# Patient Record
Sex: Female | Born: 1975 | Race: White | Hispanic: Yes | Marital: Married | Smoking: Never smoker
Health system: Southern US, Community
[De-identification: ages and names within clinical notes are randomized; demographics above are authoritative.]

## PROBLEM LIST (undated history)

## (undated) ENCOUNTER — Inpatient Hospital Stay (HOSPITAL_COMMUNITY): Payer: Self-pay

## (undated) DIAGNOSIS — O139 Gestational [pregnancy-induced] hypertension without significant proteinuria, unspecified trimester: Secondary | ICD-10-CM

## (undated) DIAGNOSIS — D649 Anemia, unspecified: Secondary | ICD-10-CM

## (undated) DIAGNOSIS — I10 Essential (primary) hypertension: Secondary | ICD-10-CM

## (undated) DIAGNOSIS — F419 Anxiety disorder, unspecified: Secondary | ICD-10-CM

## (undated) DIAGNOSIS — F32A Depression, unspecified: Secondary | ICD-10-CM

## (undated) DIAGNOSIS — F329 Major depressive disorder, single episode, unspecified: Secondary | ICD-10-CM

## (undated) HISTORY — DX: Anemia, unspecified: D64.9

## (undated) HISTORY — DX: Essential (primary) hypertension: I10

## (undated) HISTORY — DX: Anxiety disorder, unspecified: F41.9

---

## 1998-10-11 ENCOUNTER — Encounter: Payer: Self-pay | Admitting: Obstetrics

## 1998-10-11 ENCOUNTER — Ambulatory Visit (HOSPITAL_COMMUNITY): Admission: RE | Admit: 1998-10-11 | Discharge: 1998-10-11 | Payer: Self-pay | Admitting: Obstetrics

## 1999-02-27 ENCOUNTER — Inpatient Hospital Stay (HOSPITAL_COMMUNITY): Admission: AD | Admit: 1999-02-27 | Discharge: 1999-03-01 | Payer: Self-pay | Admitting: Obstetrics

## 2001-03-02 ENCOUNTER — Encounter (INDEPENDENT_AMBULATORY_CARE_PROVIDER_SITE_OTHER): Payer: Self-pay | Admitting: *Deleted

## 2001-03-02 LAB — CONVERTED CEMR LAB

## 2002-04-20 ENCOUNTER — Encounter: Admission: RE | Admit: 2002-04-20 | Discharge: 2002-04-20 | Payer: Self-pay | Admitting: Family Medicine

## 2002-09-28 ENCOUNTER — Encounter: Admission: RE | Admit: 2002-09-28 | Discharge: 2002-09-28 | Payer: Self-pay | Admitting: Family Medicine

## 2003-01-26 ENCOUNTER — Encounter: Admission: RE | Admit: 2003-01-26 | Discharge: 2003-01-26 | Payer: Self-pay | Admitting: Family Medicine

## 2003-08-30 ENCOUNTER — Encounter: Admission: RE | Admit: 2003-08-30 | Discharge: 2003-08-30 | Payer: Self-pay | Admitting: *Deleted

## 2003-09-06 ENCOUNTER — Encounter: Admission: RE | Admit: 2003-09-06 | Discharge: 2003-09-06 | Payer: Self-pay | Admitting: *Deleted

## 2003-09-06 ENCOUNTER — Inpatient Hospital Stay (HOSPITAL_COMMUNITY): Admission: AD | Admit: 2003-09-06 | Discharge: 2003-09-09 | Payer: Self-pay | Admitting: Obstetrics & Gynecology

## 2003-09-06 ENCOUNTER — Ambulatory Visit (HOSPITAL_COMMUNITY): Admission: RE | Admit: 2003-09-06 | Discharge: 2003-09-06 | Payer: Self-pay | Admitting: Obstetrics and Gynecology

## 2006-08-30 ENCOUNTER — Encounter (INDEPENDENT_AMBULATORY_CARE_PROVIDER_SITE_OTHER): Payer: Self-pay | Admitting: *Deleted

## 2007-03-07 ENCOUNTER — Encounter: Admission: RE | Admit: 2007-03-07 | Discharge: 2007-03-07 | Payer: Self-pay | Admitting: Family Medicine

## 2009-02-24 ENCOUNTER — Ambulatory Visit (HOSPITAL_COMMUNITY): Admission: RE | Admit: 2009-02-24 | Discharge: 2009-02-24 | Payer: Self-pay | Admitting: Family Medicine

## 2009-05-22 ENCOUNTER — Inpatient Hospital Stay (HOSPITAL_COMMUNITY): Admission: AD | Admit: 2009-05-22 | Discharge: 2009-05-25 | Payer: Self-pay | Admitting: Obstetrics and Gynecology

## 2009-05-22 ENCOUNTER — Inpatient Hospital Stay (HOSPITAL_COMMUNITY): Admission: AD | Admit: 2009-05-22 | Discharge: 2009-05-22 | Payer: Self-pay | Admitting: Obstetrics and Gynecology

## 2009-05-28 ENCOUNTER — Inpatient Hospital Stay (HOSPITAL_COMMUNITY): Admission: AD | Admit: 2009-05-28 | Discharge: 2009-05-28 | Payer: Self-pay | Admitting: Obstetrics and Gynecology

## 2009-11-24 ENCOUNTER — Ambulatory Visit: Payer: Self-pay | Admitting: Family Medicine

## 2009-12-02 ENCOUNTER — Ambulatory Visit: Payer: Self-pay | Admitting: Internal Medicine

## 2009-12-22 ENCOUNTER — Ambulatory Visit: Payer: Self-pay | Admitting: Internal Medicine

## 2010-01-05 ENCOUNTER — Ambulatory Visit: Payer: Self-pay | Admitting: Internal Medicine

## 2010-02-15 ENCOUNTER — Ambulatory Visit: Payer: Self-pay | Admitting: Internal Medicine

## 2010-07-23 ENCOUNTER — Encounter: Payer: Self-pay | Admitting: Family Medicine

## 2010-10-04 LAB — CBC
Hemoglobin: 10.7 g/dL — ABNORMAL LOW (ref 12.0–15.0)
MCHC: 33.4 g/dL (ref 30.0–36.0)
MCHC: 33.6 g/dL (ref 30.0–36.0)
MCHC: 33.8 g/dL (ref 30.0–36.0)
MCV: 87.4 fL (ref 78.0–100.0)
MCV: 87.9 fL (ref 78.0–100.0)
Platelets: 145 10*3/uL — ABNORMAL LOW (ref 150–400)
Platelets: 166 10*3/uL (ref 150–400)
RBC: 3.61 MIL/uL — ABNORMAL LOW (ref 3.87–5.11)
RBC: 4.14 MIL/uL (ref 3.87–5.11)
RDW: 14.3 % (ref 11.5–15.5)
WBC: 12.6 10*3/uL — ABNORMAL HIGH (ref 4.0–10.5)
WBC: 14.1 10*3/uL — ABNORMAL HIGH (ref 4.0–10.5)

## 2010-10-04 LAB — CROSSMATCH
ABO/RH(D): A POS
Antibody Screen: NEGATIVE

## 2010-10-04 LAB — PROTIME-INR: Prothrombin Time: 14.1 seconds (ref 11.6–15.2)

## 2010-10-04 LAB — RPR: RPR Ser Ql: NONREACTIVE

## 2010-11-17 NOTE — Discharge Summary (Signed)
Nicole Dodson, Nicole Dodson                  ACCOUNT NO.:  [id number]   MEDICAL RECORD NO.:  [id number]                   PATIENT TYPE:  INP   LOCATION:  9101                                 FACILITY:  [facility]   PHYSICIAN:  Franklyn Lor, MD                      DATE OF BIRTH:  1976-01-15   DATE OF ADMISSION:  09/06/2003  DATE OF DISCHARGE:  09/09/2003                                 DISCHARGE SUMMARY   ADMISSION DIAGNOSIS:  Primary Cesarean section secondary to history of  shoulder dystocia.   This is a 35 year old, G2, P1-0-0-1, who presented at 38-37 weeks estimated  gestational age by LMP and first trimester ultrasound complaining of  contractions times one day.  At that time her membranes were intact and she  had minimal vaginal spotting. Positive fetal movement. Contractions  approximately five minutes apart. The patient was A positive, antibody  negative, syphilis nonreactive, HIV negative, GC and Chlamydia negative, GBS  negative, rubella immune, and hepatitis B surface antigen negative.  At the  time of admission she was afebrile with normal vital signs. Of note the  patient had a history of with her first pregnancy of intrapartum  complications including three minute shoulder dystocia delivery with a 9-  pound female with a 4th degree perineal laceration. Based on this history, the  patient was admitted for primary Cesarean section. On September 06, 2003, Dr.  Elsie Lincoln performed a primary low transverse Cesarean section on this  patient and gave rise to a viable female infant with Apgars of 9/9. Baby  weighed 9 pounds and 12 ounces, and had clear fluids, was in vertex  position, three-vessel placenta, delivered spontaneously. Estimated blood  loss was 800 cc. The patient had an uncomplicated postoperative course with  a postoperative hemoglobin of 10.1. She was breastfeeding without  difficulty. Given ibuprofen 600 mg q.6h. p.r.n. pain and Percocet 5/325 mg  q.4h. p.r.n. pain. On  discharge, the patient was given prescription for  prenatal vitamins one p.o. daily. The patient's staples were removed prior  to discharge. The patient was given instructions on follow-up and she chose  to have IUD insertion done at six-week follow-up visit.                                               Franklyn Lor, MD    TD/MEDQ  D:  09/28/2003  T:  09/28/2003  Job:  [id number]

## 2010-11-17 NOTE — Op Note (Signed)
NAME:  Nicole Dodson, Nicole Dodson                  ACCOUNT NO.:  [id number]   MEDICAL RECORD NO.:  [id number]                   PATIENT TYPE:  INP   LOCATION:  9101                                 FACILITY:  [facility]   PHYSICIAN:  Lesly Dukes, M.D.              DATE OF BIRTH:  [birth year]   DATE OF PROCEDURE:  09/06/2003  DATE OF DISCHARGE:                                 OPERATIVE REPORT   PREOPERATIVE DIAGNOSIS:  Para 1-0-0-1 at 39-3/7 weeks with history of  shoulder dystocia and estimated fetal weight of 4400 g this pregnancy in  active labor.   POSTOPERATIVE DIAGNOSIS:  Para 1-0-0-1 at 39-3/7 weeks with history of  shoulder dystocia and estimated fetal weight of 4400 g this pregnancy in  active labor.   PROCEDURE:  Primary low flap transverse cesarean section.   SURGEON:  Lesly Dukes, M.D.   ASSISTANT:  Bradly Bienenstock, M.D.   ESTIMATED BLOOD LOSS:  800 mL.   COMPLICATIONS:  None.   PATHOLOGY:  None.   FINDINGS:  Viable female infant, Apgars of 9 at one and 9 at five, weight 9  pounds 12 ounces, clear fluid, vertex, normal fallopian tubes and ovaries  bilaterally and normal uterus; __________ at delivery.   DESCRIPTION OF PROCEDURE:  After informed consent was obtained, the patient  was taken to the operating room where spinal anesthesia was found to be  adequate.  The patient was prepped and draped in the normal sterile fashion.  Foley was placed in the bladder.  Pfannenstiel skin incision was made with  scalpel.  It was carried down to underlying layer of fascia.  The fascia was  incised in the midline and extended bilaterally with Mayo scissors.  Superior and inferior  aspects of the fascial incision were grasped with  Kocher clamps, tented up and dissected off sharply and bluntly from  underlying layers of rectus muscles.  The rectus muscle were separated in  the midline.  Peritoneum was identified, tented up and entered sharply with  Metzenbaum scissors.  Incision  was extended both superior and inferiorly  with good visualization of the bladder.  The bladder blade was inserted.  Vesicouterine peritoneum was identified, tented up and entered sharply with  Metzenbaum scissors.  This incision was extended bilaterally, the bladder  flap was created digitally.  Bladder blade was reinserted.  The uterine  incision was made in transverse fashion in lower uterine segment, extended  bilaterally with bandage scissors.  The baby's head __________ and mouth  suctioned.  The rest of the baby's body was delivered without complication.  The cord was clamped and cut and the baby was handed off to the awaiting  pediatrician.  Cord blood was set for type and screen.  The placenta  delivered spontaneously with three vessel cord.  The uterus was cleared of  all clots and debris.  The uterine incision was closed with 0 Vicryl in a  running locked fashion.  Good hemostasis  was noted.  The gutters were  cleared of all clots and debris and uterine hemostasis was assured one last  time.  The fascia was closed with 0  Vicryl in a running fashion.  Good hemostasis was noted.  Subcuticular  tissues was copiously irrigated and skin was closed with staples.  The  patient tolerated the procedure well.  Sponge, lap, needle and instrument  counts correct x 2.  The patient taken to the recovery room in stable  condition.                                               Lesly Dukes, M.D.    Lora Paula  D:  09/06/2003  T:  09/07/2003  Job:  [id number]

## 2011-10-26 ENCOUNTER — Other Ambulatory Visit: Payer: Self-pay | Admitting: Family Medicine

## 2012-03-28 ENCOUNTER — Inpatient Hospital Stay (HOSPITAL_COMMUNITY): Payer: Self-pay

## 2012-03-28 ENCOUNTER — Inpatient Hospital Stay (HOSPITAL_COMMUNITY)
Admission: AD | Admit: 2012-03-28 | Discharge: 2012-03-28 | Disposition: A | Discharge status: Home / Self Care | Payer: Self-pay | Source: Ambulatory Visit | Attending: Obstetrics & Gynecology | Admitting: Obstetrics & Gynecology

## 2012-03-28 ENCOUNTER — Encounter (HOSPITAL_COMMUNITY): Payer: Self-pay | Admitting: *Deleted

## 2012-03-28 DIAGNOSIS — N949 Unspecified condition associated with female genital organs and menstrual cycle: Secondary | ICD-10-CM | POA: Insufficient documentation

## 2012-03-28 DIAGNOSIS — L293 Anogenital pruritus, unspecified: Secondary | ICD-10-CM | POA: Insufficient documentation

## 2012-03-28 DIAGNOSIS — Z30432 Encounter for removal of intrauterine contraceptive device: Secondary | ICD-10-CM | POA: Insufficient documentation

## 2012-03-28 DIAGNOSIS — O2630 Retained intrauterine contraceptive device in pregnancy, unspecified trimester: Secondary | ICD-10-CM

## 2012-03-28 DIAGNOSIS — O9989 Other specified diseases and conditions complicating pregnancy, childbirth and the puerperium: Secondary | ICD-10-CM

## 2012-03-28 DIAGNOSIS — O99891 Other specified diseases and conditions complicating pregnancy: Secondary | ICD-10-CM | POA: Insufficient documentation

## 2012-03-28 HISTORY — DX: Major depressive disorder, single episode, unspecified: F32.9

## 2012-03-28 HISTORY — DX: Gestational (pregnancy-induced) hypertension without significant proteinuria, unspecified trimester: O13.9

## 2012-03-28 HISTORY — DX: Depression, unspecified: F32.A

## 2012-03-28 LAB — POCT PREGNANCY, URINE: Preg Test, Ur: POSITIVE — AB

## 2012-03-28 LAB — WET PREP, GENITAL: Yeast Wet Prep HPF POC: NONE SEEN

## 2012-03-28 MED ORDER — PRENATAL PLUS 27-1 MG PO TABS: 1.0000 | ORAL_TABLET | Freq: Every day | ORAL | Status: DC

## 2012-03-28 NOTE — MAU Note (Signed)
Patient stats she has had a IUD for 3 years. Has had a positive pregnancy test at the [facility]. Has had some nausea. No pain or bleeding. Patient wants to have the IUD taken out. Has a vaginal discharge with itching.

## 2012-03-28 NOTE — MAU Note (Signed)
This patient remains at the registration desk waiting for a translator.

## 2012-03-28 NOTE — MAU Provider Note (Signed)
Chief Complaint: Possible Pregnancy   First Provider Initiated Contact with Patient 03/28/12 1632     SUBJECTIVE HPI: Nicole Dodson is a 36 y.o. G3P3 at [redacted]w[redacted]d by LMP who presents with request to remove IUD which has been in place for 3 years. She is worried that she's pregnant with an IUD in place. She denies bleeding or abdominal pain.She also has increased vaginal discharge, slightly pruritic at times.  Past Medical History  Diagnosis Date  . Depression   . Pregnancy induced hypertension    OB History    Grav Para Term Preterm Abortions TAB SAB Ect Mult Living   3 3        3      # Outc Date GA Lbr Len/2nd Wgt Sex Del Anes PTL Lv   1 PAR            2 PAR            3 PAR              Past Surgical History  Procedure Date  . Cesarean section    History   Social History  . Marital Status: Married    Spouse Name: N/A    Number of Children: N/A  . Years of Education: N/A   Occupational History  . Not on file.   Social History Main Topics  . Smoking status: Never Smoker   . Smokeless tobacco: Not on file  . Alcohol Use: No  . Drug Use:   . Sexually Active: Yes    Birth Control/ Protection: IUD   Other Topics Concern  . Not on file   Social History Narrative  . No narrative on file   No current facility-administered medications on file prior to encounter.   No current outpatient prescriptions on file prior to encounter.   No Known Allergies  ROS: Pertinent items in HPI  OBJECTIVE Blood pressure 125/76, pulse 92, temperature 98.4 F (36.9 C), temperature source Oral, resp. rate 16, height 5' 1.5" (1.562 m), weight 70.217 kg (154 lb 12.8 oz), last menstrual period 02/13/2012, SpO2 100.00%. GENERAL: Well-developed, well-nourished female in no acute distress.  HEENT: Normocephalic HEART: normal rate RESP: normal effort ABDOMEN: Soft, non-tender EXTREMITIES: Nontender, no edema NEURO: Alert and oriented SPECULUM EXAM: NEFG, physiologic discharge, no  blood noted, cervix clean. [place] IUD strings protruding 1 cm from external os. BIMANUAL: cervix posterior/ thick/ closed; uterus ULNS, no adnexal tenderness or masses  LAB RESULTS Results for orders placed during the hospital encounter of 03/28/12 (from the past 24 hour(s))  POCT PREGNANCY, URINE     Status: Abnormal   Collection Time   03/28/12  4:17 PM      Component Value Range   Preg Test, Ur POSITIVE (*) NEGATIVE  WET PREP, GENITAL     Status: Abnormal   Collection Time   03/28/12  5:05 PM      Component Value Range   Yeast Wet Prep HPF POC NONE SEEN  NONE SEEN   Trich, Wet Prep NONE SEEN  NONE SEEN   Clue Cells Wet Prep HPF POC NONE SEEN  NONE SEEN   WBC, Wet Prep HPF POC FEW (*) NONE SEEN    IMAGING  Clinical Data: Post removal of IUD.  OBSTETRIC <14 WK [place] AND TRANSVAGINAL OB US  Technique: Both transabdominal and transvaginal ultrasound  examinations were performed for complete evaluation of the  gestation as well as the maternal uterus, adnexal regions, and  pelvic cul-de-sac. Transvaginal technique  was performed to assess  early pregnancy.  Comparison: None.  Intrauterine gestational sac: Present and single  Yolk sac: Present  Embryo: Not identified  MSD: 15 mm six w three d  Maternal uterus/adnexae:  Normal uterus and ovaries. Corpus luteal cyst in the right ovary.  No free fluid.  IMPRESSION:  1. Single intrauterine gestation sac with yolk sac is most  consistent with an early intrauterine pregnancy.  2. Estimated age by mean sac diameter equals 6 weeks 3 days.  Original Report Authenticated By: Genevive Bi, M.D. results found.  MAU COURSE Procedure: IUD Removal  Via interpreter, I explained indication for removal of IUD in 6 wk pregnancy: to reduce the risk or miscarriage and infection; alternative of leaving it in place could lead to SAB, infection, PTB. Waiting longer increases the chance the strings will retract into the endometrial cavity making  removal more difficult and risky. She understands and elects to proceed with removal. Patient was in the dorsal lithotomy position, normal external genitalia was noted.  A speculum was placed in the patient's vagina, normal discharge was noted, no lesions. The multiparous cervix was visualized, no lesions, no abnormal discharge; WP, GC/CT cultures obtained.  The strings of the IUD were grasped and pulled using ring forceps.  The IUD was successfully removed in its entirety.  Patient tolerated the procedure well.     ASSESSMENT 1. Pregnancy with IUD in place, antepartum   G2P1001 at [redacted]w[redacted]d with early IUP IUD removed  PLAN Discharge home. Return if heavy bleeding or abdominal pain.    Medication List     As of 03/28/2012  6:02 PM    TAKE these medications         prenatal vitamin w/FE, FA 27-1 MG Tabs   Take 1 tablet by mouth daily.        list of providers, pregnancy precautions, maturity eligibility instructions given C/W Dr. Ellison Carwin, CNM 03/28/2012  4:33 PM

## 2012-03-29 LAB — GC/CHLAMYDIA PROBE AMP, GENITAL: Chlamydia, DNA Probe: NEGATIVE

## 2012-05-08 ENCOUNTER — Other Ambulatory Visit (HOSPITAL_COMMUNITY): Payer: Self-pay

## 2012-05-08 ENCOUNTER — Other Ambulatory Visit (HOSPITAL_COMMUNITY): Payer: Self-pay | Admitting: Obstetrics

## 2012-05-08 ENCOUNTER — Ambulatory Visit (HOSPITAL_COMMUNITY)
Admission: RE | Admit: 2012-05-08 | Discharge: 2012-05-08 | Disposition: A | Discharge status: Home / Self Care | Payer: Self-pay | Source: Ambulatory Visit | Attending: Obstetrics | Admitting: Obstetrics

## 2012-05-08 DIAGNOSIS — O09299 Supervision of pregnancy with other poor reproductive or obstetric history, unspecified trimester: Secondary | ICD-10-CM | POA: Insufficient documentation

## 2014-05-03 ENCOUNTER — Encounter (HOSPITAL_COMMUNITY): Payer: Self-pay | Admitting: *Deleted

## 2020-07-21 ENCOUNTER — Other Ambulatory Visit: Payer: Self-pay | Admitting: Obstetrics and Gynecology

## 2020-07-21 DIAGNOSIS — Z1231 Encounter for screening mammogram for malignant neoplasm of breast: Secondary | ICD-10-CM

## 2020-08-05 ENCOUNTER — Other Ambulatory Visit: Payer: Self-pay

## 2020-08-05 ENCOUNTER — Ambulatory Visit (HOSPITAL_COMMUNITY)
Admission: EM | Admit: 2020-08-05 | Discharge: 2020-08-05 | Disposition: A | Discharge status: Home / Self Care | Payer: Self-pay | Attending: Urgent Care | Admitting: Urgent Care

## 2020-08-05 ENCOUNTER — Encounter (HOSPITAL_COMMUNITY): Payer: Self-pay

## 2020-08-05 DIAGNOSIS — I10 Essential (primary) hypertension: Secondary | ICD-10-CM

## 2020-08-05 DIAGNOSIS — Z3202 Encounter for pregnancy test, result negative: Secondary | ICD-10-CM

## 2020-08-05 DIAGNOSIS — N644 Mastodynia: Secondary | ICD-10-CM

## 2020-08-05 LAB — POC URINE PREG, ED: Preg Test, Ur: NEGATIVE

## 2020-08-05 MED ORDER — LOSARTAN POTASSIUM 50 MG PO TABS: 50.0000 mg | ORAL_TABLET | Freq: Every day | ORAL | 0 refills | Status: DC

## 2020-08-05 MED ORDER — NAPROXEN 375 MG PO TABS: 375.0000 mg | ORAL_TABLET | Freq: Two times a day (BID) | ORAL | 0 refills | Status: DC

## 2020-08-05 NOTE — ED Provider Notes (Signed)
Nicole Dodson - [facility]   MRN: [id number] DOB: June 19, 1976  Subjective:   Nicole Dodson is a 45 y.o. female presenting for 1 month history of persistent recurrent bilateral breast tenderness.  Has had this problem before more than 10 years ago and was told that she had fibroids in her breast.  Denies any fever, skin changes, nipple discharge, rashes, nausea, vomiting, cough, shortness of breath.  She actually called the breast center and scheduled an appointment for mammogram and has it in 2 weeks.  She does not have a regular care doctor.  Has concerns about her high blood pressure.  States that she checks it at home and is usually in the 140s as well.  Has never been prescribed medication for this.  Admits that she does use a lot of salt in her diet.  No current facility-administered medications for this encounter.  Current Outpatient Medications:  .  prenatal vitamin w/FE, FA (PRENATAL 1 + 1) 27-1 MG TABS, Take 1 tablet by mouth daily., Disp: 30 each, Rfl: 0   No Known Allergies  Past Medical History:  Diagnosis Date  . Depression   . Pregnancy induced hypertension      Past Surgical History:  Procedure Laterality Date  . CESAREAN SECTION      History reviewed. No pertinent family history.  Social History   Tobacco Use  . Smoking status: Never Smoker  . Smokeless tobacco: Never Used  Substance Use Topics  . Alcohol use: No    ROS   Objective:   Vitals: BP (!) 162/85 (BP Location: Right Arm)   Pulse 82   Temp 97.9 F (36.6 C) (Oral)   Resp 18   LMP 07/16/2020 (Exact Date)   SpO2 100%   Breastfeeding Unknown   Physical Exam Exam conducted with a chaperone present Water quality scientist).  Constitutional:      General: She is not in acute distress.    Appearance: Normal appearance. She is well-developed. She is not ill-appearing, toxic-appearing or diaphoretic.  HENT:     Head: Normocephalic and atraumatic.     Nose: Nose normal.     Mouth/Throat:      Mouth: Mucous membranes are moist.  Eyes:     Extraocular Movements: Extraocular movements intact.     Pupils: Pupils are equal, round, and reactive to light.  Cardiovascular:     Rate and Rhythm: Normal rate and regular rhythm.     Pulses: Normal pulses.     Heart sounds: Normal heart sounds. No murmur heard. No friction rub. No gallop.   Pulmonary:     Effort: Pulmonary effort is normal. No respiratory distress.     Breath sounds: Normal breath sounds. No stridor. No wheezing, rhonchi or rales.  Chest:  Breasts: Breasts are symmetrical.     Right: Tenderness present. No swelling, bleeding, inverted nipple, mass, nipple discharge or skin change.     Left: Tenderness present. No swelling, bleeding, inverted nipple, mass, nipple discharge or skin change.      Comments: Tenderness about either breast, right worse than the left. Skin:    General: Skin is warm and dry.     Findings: No rash.  Neurological:     Mental Status: She is alert and oriented to person, place, and time.  Psychiatric:        Mood and Affect: Mood normal.        Behavior: Behavior normal.        Thought Content: Thought content normal.  Results for orders placed or performed during the hospital encounter of 08/05/20 (from the past 24 hour(s))  POC urine preg, ED (not at [facility])     Status: None   Collection Time: 08/05/20 11:13 AM  Result Value Ref Range   Preg Test, Ur NEGATIVE NEGATIVE    Assessment and Plan :   PDMP not reviewed this encounter.  1. Breast pain   2. Mastalgia   3. Essential hypertension     Counseled on need for dietary modifications, start losartan.  Regarding her breast pain, will have her use naproxen, suspect fibrocystic changes.  Avoid a lot of caffeine products.  Hydrate better.  Keep appointment for mammogram.  Establish care with new PCP for follow-up. Counseled patient on potential for adverse effects with medications prescribed/recommended today, ER and return-to-clinic  precautions discussed, patient verbalized understanding.    Wallis Bamberg, PA-C 08/05/20 1230

## 2020-08-05 NOTE — ED Triage Notes (Signed)
Pt presents with bilateral breast tenderness and arms pain x 20 day. States this happened in the past and was told 12-13 years ago she have fibroids in the breast. Denies breast drainage, changes in nipple color, shape or texture.

## 2020-08-05 NOTE — ED Notes (Signed)
Chaperoned provider for breast exam

## 2020-08-05 NOTE — Discharge Instructions (Signed)
Losartan es para la presion, es diariamente. Evite la sal en su comida. Naproxen es para dolor y inflamacion.

## 2020-08-25 ENCOUNTER — Encounter (INDEPENDENT_AMBULATORY_CARE_PROVIDER_SITE_OTHER): Payer: Self-pay

## 2020-08-25 ENCOUNTER — Ambulatory Visit
Admission: RE | Admit: 2020-08-25 | Discharge: 2020-08-25 | Disposition: A | Discharge status: Home / Self Care | Payer: No Typology Code available for payment source | Source: Ambulatory Visit | Attending: Obstetrics and Gynecology | Admitting: Obstetrics and Gynecology

## 2020-08-25 ENCOUNTER — Other Ambulatory Visit: Payer: Self-pay

## 2020-08-25 ENCOUNTER — Ambulatory Visit: Payer: Self-pay | Admitting: *Deleted

## 2020-08-25 VITALS — BP 148/82 | Wt 150.7 lb

## 2020-08-25 DIAGNOSIS — Z1239 Encounter for other screening for malignant neoplasm of breast: Secondary | ICD-10-CM

## 2020-08-25 DIAGNOSIS — Z1231 Encounter for screening mammogram for malignant neoplasm of breast: Secondary | ICD-10-CM

## 2020-08-25 DIAGNOSIS — N644 Mastodynia: Secondary | ICD-10-CM

## 2020-08-25 NOTE — Progress Notes (Signed)
[name] is a 45 y.o. female who presents to [facility] clinic today with complaint of 1 month history of  bilateral diffuse breast tenderness that resolved after her visit to the Urgent care 08/05/2020 per patient. Patient stated the pain usually occurs before and during her menstrual period. She stated is been occurring since 2008. She stated was told that she had fibroids in her breast. Patient had a bilateral breast ultrasound completed 03/07/2007 that was negative with a screening mammogram recommended at age 54. Per patient this pain has been occurring since prior to the bilateral breast ultrasound. Patient denied the pain today. She stated the patient usually occurs before and during her menstrual period.   Pap Smear: Pap smear not completed today. Last Pap smear was 12/03/2017 at the [facility] and was normal. Per patient has no history of an abnormal Pap smear. Last Pap smear result is not available in Epic.   Physical exam: Breasts Breasts symmetrical. No skin abnormalities bilateral breasts. No nipple retraction bilateral breasts. No nipple discharge bilateral breasts. No lymphadenopathy. No lumps palpated bilateral breasts. No complaints of pain or tenderness on exam.      Pelvic/Bimanual Pap is not indicated today per BCCCP guidelines.    Smoking History: Patient has never smoked.   Patient Navigation: Patient education provided. Access to services provided for patient through [facility] program. Spanish interpreter Natale Lay from [facility] provided.    Breast and Cervical Cancer Risk Assessment: Patient does not have family history of breast cancer, known genetic mutations, or radiation treatment to the chest before age 36. Patient does not have history of cervical dysplasia, immunocompromised, or DES exposure in-utero.  Risk Assessment    Risk Scores      08/25/2020   Last edited by: Meryl Dare, CMA   5-year risk: 0.4 %   Lifetime risk:  5.7 %          A: BCCCP exam without pap smear No complaints today.  P: Referred patient to the [facility] for a screening mammogram on the mobile unit. Appointment scheduled Thursday, August 25, 2020 at 0940.  [name], RN 08/25/2020 8:52 AM

## 2020-08-25 NOTE — Patient Instructions (Signed)
Explained breast self awareness with Nicole Dodson. Patient did not need a Pap smear today due to last Pap smear was 12/03/2017. Let her know BCCCP will cover Pap smears every 3 years unless has a history of abnormal Pap smears. Referred patient to the [facility] for a screening mammogram on the mobile unit. Appointment scheduled Thursday, August 25, 2020 at 0940. Patient escorted to the mobile unit following BCCCP appointment for her screening mammogram. Let patient know the Breast Center will follow up with her within the next couple weeks with results of her mammogram by letter or phone. Nicole Dodson verbalized understanding.  Adryel Wortmann, Kathaleen Maser, RN 8:53 AM

## 2020-09-07 ENCOUNTER — Inpatient Hospital Stay: Payer: Self-pay | Attending: Obstetrics and Gynecology | Admitting: *Deleted

## 2020-09-07 ENCOUNTER — Other Ambulatory Visit: Payer: Self-pay

## 2020-09-07 VITALS — BP 138/84 | Ht 63.0 in | Wt 151.4 lb

## 2020-09-07 DIAGNOSIS — Z0000 Encounter for general adult medical examination without abnormal findings: Secondary | ICD-10-CM

## 2020-09-07 NOTE — Progress Notes (Signed)
[facility] initial screening   Interpreter- Alene Mires, [employer]   Clinical Measurement: There were no vitals filed for this visit. Fasting Labs Drawn Today, will review with patient when they result.   Medical History:  Patient states that she does not have high cholesterol, has high blood pressure and she does not have diabetes.  Medications:  Patient states that she does take medication to lower blood pressure. Patient does not take medication to lower cholesterol or blood sugar.  Patient does not take an aspirin a day to help prevent a heart attack or stroke. During the past 7 days patient has taken prescribed medication to lower blood pressure on 7 days.   Blood pressure, self measurement: Patient states that she does measure blood pressure from home. She checks her blood pressure a few times per week. She shares her readings with a health care provider: no.   Nutrition: Patient states that on average she eats 2 cups of fruit and 0 cups of vegetables per day. Patient states that she does not eat fish at least 2 times per week. Patient eats less than half servings of whole grains. Patient drinks less than 36 ounces of beverages with added sugar weekly: yes. Patient is currently watching sodium or salt intake: no. In the past 7 days patient has consumed drinks containing alcohol on 0 days. On a day that patient consumes drinks containing alcohol on average 0 drinks are consumed.      Physical activity:  Patient states that she gets 140 minutes of moderate and 140 minutes of vigorous physical activity each week.  Smoking status:  Patient states that she has has never smoked .   Quality of life:  Over the past 2 weeks patient states that she had little interest or pleasure in doing things: several days. She has been feeling down, depressed or hopeless:several days.    Risk reduction and counseling: Explained that the daily recommendation is for 2 cups of fruit and 3 cups of vegetables per day.  Showed patient what 1 serving would look like. Patient stated that she does not eat fish often but when she does she eats tilapia. Gave suggestions for heart healthy fish that she can add in diet such as salmon, tuna, mackerel or sardines. Patient consumes oatmeal on a regular basis. Gave suggestions for other whole grains that she can add in diet such as whole wheat bread or pasta, brown rice or whole grain cereals. Encouraged patient to start watching the amount of sodium that she consumes given hx of hypertension. Patient currently works out daily for 40 minutes. Encouraged patient to continue with exercise routine.    Navigation:  I will notify patient of lab results.  Patient is aware of 2 more health coaching sessions and a follow up. Will refer patient to [facility] for follow-up for elevated BP.   Time: 30 minutes

## 2020-09-08 LAB — HEMOGLOBIN A1C
Est. average glucose Bld gHb Est-mCnc: 103 mg/dL
Hgb A1c MFr Bld: 5.2 % (ref 4.8–5.6)

## 2020-09-08 LAB — LIPID PANEL
Chol/HDL Ratio: 2.5 ratio (ref 0.0–4.4)
Cholesterol, Total: 136 mg/dL (ref 100–199)
HDL: 55 mg/dL (ref >39)
LDL Chol Calc (NIH): 64 mg/dL (ref 0–99)
Triglycerides: 90 mg/dL (ref 0–149)
VLDL Cholesterol Cal: 17 mg/dL (ref 5–40)

## 2020-09-08 LAB — GLUCOSE, RANDOM: Glucose: 97 mg/dL (ref 65–99)

## 2020-09-13 ENCOUNTER — Telehealth: Payer: Self-pay

## 2020-09-13 NOTE — Telephone Encounter (Signed)
Health coaching 2   interpreter- [employer] [phone]   Labs- 136 cholesterol, 64 LDL cholesterol, 90 triglycerides, 55 HDL cholesterol, 5.2 hemoglobin A1C, 97 mean plasma glucose. Patient understands and is aware of her lab results.   Goals-  Discussed lab results with patient and answered any questions that patient had regarding results.   1. Increase the amount of daily vegetables consumed. Goal of 3 cups per day. Start off small with at least 1 cup a day and increase up to 3 cups per day. 2. Increase the amount of heart healthy fish consumed. Goal for 2 servings per week. Suggestions include: salmon, tuna, mackerel, sardines or seabass. 3. Continue watching salt intake. 4. Continue checking blood pressure from home and recording readings to show provider at Internal Medicine.    Navigation:  Patient is aware of 1 more health coaching sessions and a follow up. Patient is scheduled for follow-up appointment with Internal Medicine for elevated blood pressure on Thursday, September 22, 2020 @ 3:15 pm.  Time- 17 minutes

## 2020-09-22 ENCOUNTER — Other Ambulatory Visit: Payer: Self-pay

## 2020-09-22 ENCOUNTER — Other Ambulatory Visit: Payer: Self-pay | Admitting: *Deleted

## 2020-09-22 ENCOUNTER — Ambulatory Visit (INDEPENDENT_AMBULATORY_CARE_PROVIDER_SITE_OTHER): Payer: Self-pay | Admitting: Internal Medicine

## 2020-09-22 VITALS — BP 139/82 | HR 99 | Temp 98.1°F | Wt 155.2 lb

## 2020-09-22 DIAGNOSIS — I10 Essential (primary) hypertension: Secondary | ICD-10-CM

## 2020-09-22 DIAGNOSIS — N644 Mastodynia: Secondary | ICD-10-CM

## 2020-09-22 NOTE — Progress Notes (Signed)
   CC: [facility] manager   HPI:  [name] is a 45 y.o. M/F, with a PMH noted below, who presents to the clinic to establish care and to discuss the findings of her mammogram. To see the management of their acute and chronic conditions, please see the A&P note under the Encounters tab.   Past Medical History:  Diagnosis Date  . Depression   . Pregnancy induced hypertension    Review of Systems:   Review of Systems  Constitutional: Negative for chills, fever, malaise/fatigue and weight loss.  Respiratory: Negative for cough and hemoptysis.   Cardiovascular: Negative for chest pain, palpitations and orthopnea.  Gastrointestinal: Negative for abdominal pain, blood in stool, constipation, diarrhea, nausea and vomiting.  Musculoskeletal: Negative for back pain, joint pain and neck pain.       Bilateral breast pain  Neurological: Negative for dizziness, tingling, tremors and headaches.     Physical Exam:  Vitals:   09/22/20 1515  BP: 139/82  Pulse: 99  Temp: 98.1 F (36.7 C)  TempSrc: Oral  SpO2: 100%  Weight: 155 lb 3.2 oz (70.4 kg)   Physical Exam Constitutional:      General: She is not in acute distress.    Appearance: Normal appearance. She is not ill-appearing, toxic-appearing or diaphoretic.  HENT:     Head: Normocephalic and atraumatic.  Cardiovascular:     Rate and Rhythm: Normal rate and regular rhythm.     Pulses: Normal pulses.     Heart sounds: Normal heart sounds. No murmur heard. No friction rub. No gallop.   Pulmonary:     Effort: Pulmonary effort is normal.     Breath sounds: Normal breath sounds. No wheezing, rhonchi or rales.  Abdominal:     General: Abdomen is flat. Bowel sounds are normal.     Palpations: Abdomen is soft.     Tenderness: There is no abdominal tenderness. There is no guarding.  Musculoskeletal:        General: No swelling or tenderness.  Skin:    General: Skin is warm and dry.  Neurological:     Mental  Status: She is alert and oriented to person, place, and time.  Psychiatric:        Mood and Affect: Mood normal.        Behavior: Behavior normal.      Assessment & Plan:   See Encounters Tab for problem based charting.  Patient discussed with Dr. Antony Contras

## 2020-09-22 NOTE — Patient Instructions (Addendum)
[name],  It was a pleasure meeting you today. Today we talked about your blood pressure and breast pain. For your blood pressure continue taking your medications. For your next office visit, please bring your blood pressure cuff to test it against ours. For your breast pain, I would continue using Ibuprofen. Please schedule an appointment to meet our financial aid counselor before you leave.  Have a good day! Dolan Amen, MD I used google translate  [name], Fue un placer conocerte hoy. Hoy hablamos sobre tu presin arterial y Chief Technology Officer en los senos. Para su presin arterial contine tomando sus medicamentos. Para su prxima visita al consultorio, traiga su manguito de presin arterial para compararlo con el nuestro. Para el dolor de mamas yo seguira usando ibuprofeno. Programe una cita para conocer a nuestro asesor de ayuda financiera antes de irse. Que tenga un buen da! Ezzie Dural, MD Utilic el traductor de google

## 2020-09-23 ENCOUNTER — Encounter: Payer: Self-pay | Admitting: Internal Medicine

## 2020-09-23 DIAGNOSIS — N644 Mastodynia: Secondary | ICD-10-CM | POA: Insufficient documentation

## 2020-09-23 DIAGNOSIS — I10 Essential (primary) hypertension: Secondary | ICD-10-CM | POA: Insufficient documentation

## 2020-09-23 NOTE — Assessment & Plan Note (Signed)
Patient with cyclic bilateral breast pain. She comes to discuss the results of her mammogram. Her results were negative, but it is noted that she has dense breast tissue and should have another mammogram in a year.   Her breast pain occurs 1.5-2 weeks before her period and is chronic. It is bilateral and relieved with NSAID usage. Will continue conservative management.  - Mammogram yearly

## 2020-09-23 NOTE — Assessment & Plan Note (Signed)
Patient presents to the clinic with hypertension on Losartan 50 mg daily her vitals today are:  Vitals with BMI 09/22/2020 09/07/2020 09/07/2020  Height - - 5\' 3"   Weight 155 lbs 3 oz - 151 lbs 6 oz  BMI 27.5 - 26.83  Systolic 139 138  Diastolic 82 84 82  Pulse 99 - -   She does bring her BP journal with her today, and her pressures at home average around 115/78. She has an automatic blood pressure cuff at home and takes her pressures regularly. Given the discrepancy from her home pressures and the office there may be a component of white coat hypertension. Will have her bring her cuff to the office at her next visit to compare accuracy.   Additionally she has been placed on Losartan 50 mg and there is no blood work. Will collect BMP today.  - Continue Losartan 50 mg daily - BMP today - FU in 4 weeks - Patient to bring BP cuff to next appointment.

## 2020-09-26 NOTE — Progress Notes (Signed)
[facility] Clinic Attending ? ?Case discussed with Dr. Winters  At the time of the visit.  We reviewed the resident?s history and exam and pertinent patient test results.  I agree with the assessment, diagnosis, and plan of care documented in the resident?s note.  ?

## 2020-09-29 ENCOUNTER — Telehealth: Payer: Self-pay

## 2020-09-29 NOTE — Telephone Encounter (Signed)
Returned patient's phone call via Natale Lay, Haroldine Laws. Patient stated that she is still having pain in both breasts that has not resolved. Patient stated that she would like to be referred to an OB/GYN for this issue. Informed patient that [facility] would not be able to pay for follow-up since her mammogram was negative and PCP at Internal Medicine had already seen her for this problem. Informed patient that if she would like another opinion she would need to fill out the financial assistance application and self-refer to the [facility] [employer].

## 2020-10-06 ENCOUNTER — Other Ambulatory Visit: Payer: Self-pay

## 2020-10-06 NOTE — Telephone Encounter (Signed)
  losartan (COZAAR) 50 MG tablet, REFILL REQUEST @  [id number] - [place] ([place]), [place] - [address] Phone:  [phone]  Fax:  [phone]

## 2020-10-06 NOTE — Telephone Encounter (Signed)
Patient should have enough Losartan to last until May 4th.  LOV was 09/22/20 w/ Dr. Sande Brothers for HTN and establish care.  She is to f/u at the end of April.  RN placed TC to patient to schedule f/u, no answer and her vm box was full. Forwarding to front desk to assist in making f/u appt. Thank you, Bricyn Labrada

## 2020-10-07 MED ORDER — LOSARTAN POTASSIUM 50 MG PO TABS: 50.0000 mg | ORAL_TABLET | Freq: Every day | ORAL | 0 refills | Status: DC

## 2020-10-07 NOTE — Telephone Encounter (Signed)
Return call to pt, talked to her daughter who speaks [place]. Stated pt needs a refill on BP medication; stated she has about 15 tabs left. Informed her, her mother needs to keep her appt on 4/25 to f/u on her BP - daughter stated he mother wil.

## 2020-10-07 NOTE — Telephone Encounter (Signed)
Or [phone]

## 2020-10-07 NOTE — Telephone Encounter (Signed)
Pt is calling back [phone]

## 2020-10-24 ENCOUNTER — Encounter: Payer: No Typology Code available for payment source | Admitting: Student

## 2020-10-27 ENCOUNTER — Ambulatory Visit: Payer: No Typology Code available for payment source

## 2020-11-20 ENCOUNTER — Emergency Department (HOSPITAL_BASED_OUTPATIENT_CLINIC_OR_DEPARTMENT_OTHER): Payer: No Typology Code available for payment source

## 2020-11-20 ENCOUNTER — Emergency Department (HOSPITAL_COMMUNITY)
Admission: EM | Admit: 2020-11-20 | Discharge: 2020-11-20 | Disposition: A | Discharge status: Home / Self Care | Payer: No Typology Code available for payment source | Attending: Emergency Medicine | Admitting: Emergency Medicine

## 2020-11-20 ENCOUNTER — Encounter (HOSPITAL_COMMUNITY): Payer: Self-pay | Admitting: Emergency Medicine

## 2020-11-20 ENCOUNTER — Other Ambulatory Visit: Payer: Self-pay

## 2020-11-20 DIAGNOSIS — N644 Mastodynia: Secondary | ICD-10-CM | POA: Insufficient documentation

## 2020-11-20 DIAGNOSIS — M7989 Other specified soft tissue disorders: Secondary | ICD-10-CM

## 2020-11-20 DIAGNOSIS — M79621 Pain in right upper arm: Secondary | ICD-10-CM | POA: Insufficient documentation

## 2020-11-20 DIAGNOSIS — I10 Essential (primary) hypertension: Secondary | ICD-10-CM | POA: Insufficient documentation

## 2020-11-20 DIAGNOSIS — Z79899 Other long term (current) drug therapy: Secondary | ICD-10-CM | POA: Insufficient documentation

## 2020-11-20 DIAGNOSIS — M79601 Pain in right arm: Secondary | ICD-10-CM

## 2020-11-20 LAB — CBC WITH DIFFERENTIAL/PLATELET
Abs Immature Granulocytes: 0.03 10*3/uL (ref 0.00–0.07)
Basophils Absolute: 0.1 10*3/uL (ref 0.0–0.1)
Basophils Relative: 1 %
Eosinophils Absolute: 0 10*3/uL (ref 0.0–0.5)
Eosinophils Relative: 0 %
HCT: 33.8 % — ABNORMAL LOW (ref 36.0–46.0)
Hemoglobin: 11 g/dL — ABNORMAL LOW (ref 12.0–15.0)
Immature Granulocytes: 0 %
Lymphocytes Relative: 21 %
Lymphs Abs: 1.5 10*3/uL (ref 0.7–4.0)
MCH: 28.6 pg (ref 26.0–34.0)
MCHC: 32.5 g/dL (ref 30.0–36.0)
MCV: 87.8 fL (ref 80.0–100.0)
Monocytes Absolute: 0.5 10*3/uL (ref 0.1–1.0)
Monocytes Relative: 7 %
Neutro Abs: 5.1 10*3/uL (ref 1.7–7.7)
Neutrophils Relative %: 71 %
Platelets: 266 10*3/uL (ref 150–400)
RBC: 3.85 MIL/uL — ABNORMAL LOW (ref 3.87–5.11)
RDW: 13.5 % (ref 11.5–15.5)
WBC: 7.2 10*3/uL (ref 4.0–10.5)
nRBC: 0 % (ref 0.0–0.2)

## 2020-11-20 LAB — BASIC METABOLIC PANEL
Anion gap: 6 (ref 5–15)
BUN: 15 mg/dL (ref 6–20)
CO2: 27 mmol/L (ref 22–32)
Calcium: 9 mg/dL (ref 8.9–10.3)
Chloride: 106 mmol/L (ref 98–111)
Creatinine, Ser: 0.6 mg/dL (ref 0.44–1.00)
GFR, Estimated: >60 mL/min (ref >60)
Glucose, Bld: 97 mg/dL (ref 70–99)
Potassium: 3.8 mmol/L (ref 3.5–5.1)
Sodium: 139 mmol/L (ref 135–145)

## 2020-11-20 LAB — I-STAT BETA HCG BLOOD, ED (MC, WL, AP ONLY): I-stat hCG, quantitative: <5 m[IU]/mL (ref <5)

## 2020-11-20 NOTE — ED Notes (Signed)
Pt discharged and ambulated out of the ED without difficulty. 

## 2020-11-20 NOTE — ED Provider Notes (Signed)
[facility]   CSN: [id number] Arrival date & time: 11/20/20  1115    History Chief Complaint  Patient presents with  . Arm Pain  . Breast Pain   History provided by patient with assistance of Spanish interpreter [phone].  Nicole Dodson is a 45 y.o. female who presents with concern for intermittent bilateral breast and upper arm pain since February of this year.  She was evaluated by provider at the health department in February and sent for a mammogram which was normal though she was identified to have dense breast tissue.  She states that the pain is dull and achy, comes and goes, typically following her menstrual cycle.  She has not any contraceptives, LMP was 11/01/2020.  She is currently not having any pain, however concern for persistent pain and desired further evaluation.  She was not seen in follow-up after her mammogram.  She does not take any medication to help with her pain.  She endorses that in March of this year she was experiencing chills and feeling feverish during the time she was experiencing bilateral chest pain, additionally had pain in the right upper extremity with mild swelling of the upper arm without numbness, ting, weakness in the hand.  She denies any recent travel, surgical procedures, or hormone replacement.  Denies any history of DVT or PE.  Patient has results of mammogram from February 2022 which showed no suspicion for malignancy.  Patient states she does self breast exams at home and has not identified any masses or lumps in her breasts since the pain has begun.  She denies any redness, swelling of her breast or arms, denies any nipple discharge or skin changes to her breast.  Pain is bilateral, intermittent and premenstrual, no OCPs, or recent pregnancy or birth.  No new activities, no concurrent neck back or shoulder pain.  History of chills and associated right arm pain and mild swelling.  No recent  trauma.  Not affecting patient's daily activities.  HPI     Past Medical History:  Diagnosis Date  . Depression   . Pregnancy induced hypertension     Patient Active Problem List   Diagnosis Date Noted  . Hypertension 09/23/2020  . Breast pain 09/23/2020    Past Surgical History:  Procedure Laterality Date  . CESAREAN SECTION     x2     OB History    Gravida  4   Para  3   Term      Preterm      AB      Living  3     SAB      IAB      Ectopic      Multiple      Live Births              No family history on file.  Social History   Tobacco Use  . Smoking status: Never Smoker  . Smokeless tobacco: Never Used  Vaping Use  . Vaping Use: Never used  Substance Use Topics  . Alcohol use: No  . Drug use: Never    Home Medications Prior to Admission medications   Medication Sig Start Date End Date Taking? Authorizing Provider  losartan (COZAAR) 50 MG tablet Take 1 tablet (50 mg total) by mouth daily. 10/07/20   Steffanie RainwaterAmponsah, Prosper M, MD  Multiple Vitamins-Minerals (MULTIVITAMIN WITH MINERALS) tablet Take 1 tablet by mouth daily.    [provider]  naproxen (  NAPROSYN) 375 MG tablet Take 1 tablet (375 mg total) by mouth 2 (two) times daily with a meal. Patient not taking: Reported on 09/07/2020 08/05/20   Wallis Bamberg, PA-C  prenatal vitamin w/FE, FA (PRENATAL 1 + 1) 27-1 MG TABS Take 1 tablet by mouth daily. Patient not taking: No sig reported 03/28/12   Poe, Deirdre C, CNM    Allergies    Patient has no known allergies.  Review of Systems   Review of Systems  Constitutional: Positive for chills. Negative for activity change, appetite change, fatigue and fever.  HENT: Negative.   Respiratory: Negative.   Cardiovascular: Negative.   Gastrointestinal: Negative.   Genitourinary: Negative for decreased urine volume, difficulty urinating, dyspareunia, dysuria, hematuria, menstrual problem, pelvic pain, urgency, vaginal bleeding, vaginal  discharge and vaginal pain.       Breast pain bilaterally, intermittent, achy  Musculoskeletal: Positive for myalgias.       RUE  Skin: Negative.   Neurological: Negative.   Hematological: Negative.     Physical Exam Updated Vital Signs BP (!) 142/84   Pulse 74   Temp 98.2 F (36.8 C) (Oral)   Resp 18   Ht 5\' 5"  (1.651 m)   Wt 63.5 kg   SpO2 100%   BMI 23.30 kg/m   Physical Exam Vitals and nursing note reviewed. Exam conducted with a chaperone present.  Constitutional:      Appearance: She is normal weight. She is not ill-appearing or toxic-appearing.  HENT:     Head: Normocephalic and atraumatic.     Nose: Nose normal.     Mouth/Throat:     Mouth: Mucous membranes are moist.     Pharynx: Oropharynx is clear. Uvula midline. No oropharyngeal exudate, posterior oropharyngeal erythema or uvula swelling.     Tonsils: No tonsillar exudate.  Eyes:     General: Lids are normal. Vision grossly intact.        Right eye: No discharge.        Left eye: No discharge.     Extraocular Movements: Extraocular movements intact.     Conjunctiva/sclera: Conjunctivae normal.     Pupils: Pupils are equal, round, and reactive to light.  Neck:     Trachea: Trachea and phonation normal.  Cardiovascular:     Rate and Rhythm: Normal rate and regular rhythm.     Pulses: Normal pulses.     Heart sounds: Normal heart sounds. No murmur heard.   Pulmonary:     Effort: Pulmonary effort is normal. No tachypnea, bradypnea, accessory muscle usage or respiratory distress.     Breath sounds: Normal breath sounds. No wheezing or rales.  Chest:     Chest wall: No mass, lacerations, deformity, swelling, tenderness, crepitus or edema. There is no dullness to percussion.  Breasts:     Tanner Score is 5. Breasts are symmetrical.     Right: Normal. No swelling, bleeding, inverted nipple, mass, nipple discharge, skin change, tenderness, axillary adenopathy or supraclavicular adenopathy.     Left: Normal.  No swelling, bleeding, inverted nipple, mass, nipple discharge, skin change, tenderness, axillary adenopathy or supraclavicular adenopathy.      Comments: Patient does have dense breast tissue bilaterally, however there are no masses, skin changes, or nipple discharge.  No axillary lymphadenopathy. Abdominal:     General: Bowel sounds are normal. There is no distension.     Palpations: Abdomen is soft.     Tenderness: There is no abdominal tenderness. There is no right CVA tenderness,  left CVA tenderness, guarding or rebound.  Musculoskeletal:        General: No deformity.     Cervical back: Normal range of motion and neck supple. No edema, rigidity or crepitus. No pain with movement, spinous process tenderness or muscular tenderness.     Right lower leg: No edema.     Left lower leg: No edema.  Lymphadenopathy:     Cervical: No cervical adenopathy.     Upper Body:     Right upper body: No supraclavicular, axillary or pectoral adenopathy.     Left upper body: No supraclavicular, axillary or pectoral adenopathy.  Skin:    General: Skin is warm and dry.  Neurological:     General: No focal deficit present.     Mental Status: She is alert and oriented to person, place, and time. Mental status is at baseline.  Psychiatric:        Attention and Perception: Attention normal.        Mood and Affect: Affect normal. Mood is anxious. Mood is not elated.        Speech: Speech normal.        Cognition and Memory: Cognition normal.     ED Results / Procedures / Treatments   Labs (all labs ordered are listed, but only abnormal results are displayed) Labs Reviewed  CBC WITH DIFFERENTIAL/PLATELET - Abnormal; Notable for the following components:      Result Value   RBC 3.85 (*)    Hemoglobin 11.0 (*)    HCT 33.8 (*)    All other components within normal limits  BASIC METABOLIC PANEL  I-STAT BETA HCG BLOOD, ED ([facility], [facility], [facility] ONLY)    EKG None  Radiology UE VENOUS DUPLEX ([facility] & [facility] 7 am - 7  pm)  Result Date: 11/20/2020 UPPER VENOUS STUDY  Patient Name:  KELCY LAIBLE  Date of Exam:   11/20/2020 Medical Rec #: [id number]               Accession #:    [id number] Date of Birth: 02-Dec-1975              Patient Gender: F Patient Age:   32Y Exam Location:  [facility] Procedure:      VAS [place] UPPER EXTREMITY VENOUS DUPLEX Referring Phys: [id number] [facility] R Babara [place] --------------------------------------------------------------------------------  Indications: Pain, and Swelling Comparison Study: No prior study on file Performing Technologist: Sherren Kerns RVS  Examination Guidelines: A complete evaluation includes B-mode imaging, spectral Doppler, color Doppler, and power Doppler as needed of all accessible portions of each vessel. Bilateral testing is considered an integral part of a complete examination. Limited examinations for reoccurring indications may be performed as noted.  Right Findings: +----------+------------+---------+-----------+----------+-------+ RIGHT     CompressiblePhasicitySpontaneousPropertiesSummary +----------+------------+---------+-----------+----------+-------+ IJV           Full       Yes       Yes                      +----------+------------+---------+-----------+----------+-------+ Subclavian    Full       Yes       Yes                      +----------+------------+---------+-----------+----------+-------+ Axillary      Full       Yes       Yes                      +----------+------------+---------+-----------+----------+-------+  Brachial      Full       Yes       Yes                      +----------+------------+---------+-----------+----------+-------+ Radial        Full                                          +----------+------------+---------+-----------+----------+-------+ Ulnar         Full                                          +----------+------------+---------+-----------+----------+-------+  Cephalic      Full                                          +----------+------------+---------+-----------+----------+-------+ Basilic       Full                                          +----------+------------+---------+-----------+----------+-------+  Left Findings: +----------+------------+---------+-----------+----------+-------+ LEFT      CompressiblePhasicitySpontaneousPropertiesSummary +----------+------------+---------+-----------+----------+-------+ Subclavian               Yes       Yes                      +----------+------------+---------+-----------+----------+-------+  Summary:  Right: No evidence of deep vein thrombosis in the upper extremity. No evidence of superficial vein thrombosis in the upper extremity.  Left: No evidence of thrombosis in the subclavian.  *See table(s) above for measurements and observations.  Diagnosing physician: Waverly Ferrari MD Electronically signed by Waverly Ferrari MD on 11/20/2020 at 6:37:29 PM.    Final     Procedures Procedures   Medications Ordered in ED Medications - No data to display  ED Course  I have reviewed the triage vital signs and the nursing notes.  Pertinent labs & imaging results that were available during my care of the patient were reviewed by me and considered in my medical decision making (see chart for details).    MDM Rules/Calculators/A&P                          45 year old female presents with concern for intermittent bilateral breast pain x4 months, normal mammogram in February 2022.  Differential diagnosis includes but is not limited to fibrocystic breast changes associated with hormonal fluctuation and menstrual cycle, reaction to caffeine and lifestyle, breast cysts, malignancy, hidradenitis suppurativa, musculoskeletal injury, trauma.  Hypertensive on intake, vital signs otherwise normal.  Cardiopulmonary exam is normal, abdominal exam is benign.  Breast exam performed in presence  of chaperone without abnormality.  Patient does have dense breast tissue, however there are no masses or lumps identified on exam.  No skin changes or nipple discharge/bleeding.  Given recent normal mammogram and asymptomatic patient today, no imaging of the breast is warranted in the ED.  Will perform right upper extremity venous duplex given history of pain and intermittent swelling.  CBC with mild anemia with hemoglobin  of 11, BMP unremarkable.  Patient is not pregnant. Right upper extremity venous duplex negative for DVT or superficial venous thrombus.  No further work-up warranted in the ED today given reassuring physical exam, laboratory studies, and ultrasound.  Patient's symptoms of cyclical bilateral breast pain without mass on exam are most consistent with fibrocystic changes related to hormone fluctuation with menstrual cycle.  Recommend OB/GYN follow-up, decrease caffeine intake, and as needed Tylenol and ibuprofen.  Results and treatment plan discussed with patient with the assistance of Spanish interpreter [phone]. Fraidy voiced understanding of her medical evaluation and treatment plan.  Each of her questions was answered to her expressed satisfaction.  Return precautions were given.  Patient is well-appearing, stable, and appropriate for discharge at this time.  This chart was dictated using voice recognition software, Dragon. Despite the best efforts of this provider to proofread and correct errors, errors may still occur which can change documentation meaning.  Final Clinical Impression(s) / ED Diagnoses Final diagnoses:  Breast pain    Rx / DC Orders ED Discharge Orders    None       Sherrilee Gilles 11/21/20 Maudie Mercury, MD 11/27/20 1616

## 2020-11-20 NOTE — Progress Notes (Signed)
VASCULAR LAB    Right upper extremity venous duplex has been performed.  See CV proc for preliminary results.  Messaged results to Dr. Madilyn Hook and Loleta Dicker, PA-C via secure chat  Sherren Kerns, RVT 11/20/2020, 6:01 PM

## 2020-11-20 NOTE — ED Provider Notes (Addendum)
Emergency Medicine Provider Triage Evaluation Note  Nicole Dodson 45 y.o. female was evaluated in triage.  Pt complains of pain in bilateral breast, bilateral upper arms for 4 months.  States that she believes pain is getting worse complaint ED visit.  Had a mammogram February 2022 which showed no suspicious malignancies.  She has not had any masses or lumps in her breast.  She denies any redness or swelling of her breast or her arms.  No numbness.  She does feel like her arms are getting weaker.  No fevers, chest pain, difficulty breathing.  Language interpreter was used.   Review of Systems  Positive: Arm pain, breast pain Negative: Chest pain, difficulty breathing, redness or swelling, numbness.  Physical Exam  BP 134/82   Pulse 70   Temp 98.2 F (36.8 C) (Oral)   Resp 18   Ht 5\' 4"  (1.626 m)   Wt 65.8 kg   SpO2 100%   BMI 24.89 kg/m  Gen:   Awake, no distress  3 HEENT:  Atraumatic  Resp:  Normal effort  Cardiac:  Normal rate.  2+ radial pulses bilaterally. Abd:   Nondistended, nontender  MSK:   Moves extremities without difficulty  Neuro:  Speech clear   Other:   5/5 strength of bilateral upper extremities.  Equal grip strength noted. Good distal cap refill.  BUE is not dusky in appearance or cool to touch.  Medical Decision Making  Medically screening exam initiated at 11:38 AM  Appropriate orders placed.  Nicole Dodson was informed that the remainder of the evaluation will be completed by another provider, this initial triage assessment does not replace that evaluation. They are counseled that they will need to remain in the ED until the completion of their workup, including full H&P and results of any tests.  Risks of leaving the emergency department prior to completion of treatment were discussed. Patient was advised to inform ED staff if they are leaving before their treatment is complete. The patient acknowledged these risks and time was allowed for  questions.     The patient appears stable so that the remainder of the MSE may be completed by another provider.  Clinical Impression  Arm pain, breast pain.   Portions of this note were generated with Arliss Journey. Dictation errors may occur despite best attempts at proofreading.      Scientist, clinical (histocompatibility and immunogenetics), PA-C 11/20/20 1139    11/22/20, PA-C 11/20/20 1140    11/22/20, MD 11/20/20 1145

## 2020-11-20 NOTE — Discharge Instructions (Addendum)
You were seen in the ER today for your breast pain. Your physical exam and vital signs were reassuring. There are no abnormalities on your breast exam. The ultrasound on your arm did not reveal any blood clots.  Your pain in your breasts is likely related to fluctuations in your hormones related to your menstrual cycle. It is reassuring that your pain is intermittent and is in both breasts.   Please decrease your caffeine intake and monitor your symptoms closely, especially where they fall in your menstrual cycle. You may take ibuprofen or tylenol as needed for your discomfort.  Please follow up with the OBGYN listed below.  Return to the ER if you develop any new severe pain, swelling of your breast, numbness, tingling, weakness in your arms, or any other new severe symptoms.  Usted fue visto en la sala de emergencias hoy por su dolor en los senos. Su examen fsico y signos vitales fueron tranquilizadores. No hay anomalas en su examen de mama. La ecografa de su brazo no revel ningn cogulo de [place]. Su dolor en sus senos probablemente est relacionado con fluctuaciones en sus hormonas relacionadas con su ciclo menstrual. Es tranquilizador que su dolor sea intermitente y est en ambos senos.  Disminuya su consumo de cafena y controle sus sntomas de cerca, especialmente cuando se encuentran en su ciclo menstrual. Puede tomar ibuprofeno o tylenol segn sea necesario para su malestar. Haga un seguimiento con el obstetra y gineclogo que se indica a continuacin.  Regrese a la sala de emergencias si presenta cualquier dolor intenso nuevo, hinchazn de los senos, entumecimiento, hormigueo, debilidad en los brazos o cualquier otro sntoma grave nuevo.

## 2020-11-20 NOTE — ED Triage Notes (Signed)
Patient coming from home, complaint of arm pain and breast pain for a few months now. Denies swelling. A&Ox4

## 2021-06-14 ENCOUNTER — Telehealth: Payer: Self-pay

## 2021-06-14 NOTE — Telephone Encounter (Signed)
Attempted to contact patient about completing HC 3 for the [facility] program voicemail was not set-up, unable to leave message.

## 2021-07-24 ENCOUNTER — Other Ambulatory Visit: Payer: Self-pay

## 2021-07-24 DIAGNOSIS — Z1231 Encounter for screening mammogram for malignant neoplasm of breast: Secondary | ICD-10-CM

## 2021-09-14 ENCOUNTER — Other Ambulatory Visit: Payer: Self-pay

## 2021-09-14 ENCOUNTER — Encounter (INDEPENDENT_AMBULATORY_CARE_PROVIDER_SITE_OTHER): Payer: Self-pay

## 2021-09-14 ENCOUNTER — Ambulatory Visit: Payer: Self-pay | Admitting: *Deleted

## 2021-09-14 ENCOUNTER — Ambulatory Visit
Admission: RE | Admit: 2021-09-14 | Discharge: 2021-09-14 | Disposition: A | Discharge status: Home / Self Care | Payer: No Typology Code available for payment source | Source: Ambulatory Visit | Attending: Obstetrics and Gynecology | Admitting: Obstetrics and Gynecology

## 2021-09-14 VITALS — BP 130/86 | Wt 148.2 lb

## 2021-09-14 NOTE — Patient Instructions (Signed)
Explained breast self awareness with Dorian Heckle. Pap is due today. Unable to complete Pap smear today due to patient is currently on menstrual period. Pap smear has been scheduled for Tuesday, Nov 21, 2021 at 0830 at the free cervical cancer screening clinic. Let her know [facility] will cover Pap smears every 3 years unless has a history of abnormal Pap smears. Referred patient to the [facility] for a screening mammogram on the mobile unit. Appointment scheduled Thursday, September 14, 2021 at 1030. Patient aware of appointment and will be there. Let patient know the Breast Center will follow up with her within the next couple weeks with results of mammogram by letter or phone. Kenlyn Peralta-Hernandez verbalized understanding. ? ?Damaree Sargent, Kathaleen Maser, RN ?9:40 AM ? ? ? ? ?

## 2021-09-14 NOTE — Progress Notes (Signed)
[name] is a 46 y.o. female who presents to [facility] clinic today with complaint of bilateral diffuse  breast pain that comes and goes. She stated is been occurring since 2008. She stated was told that she had fibroids in her breast. Patient had a bilateral breast ultrasound completed 03/07/2007 that was negative with a screening mammogram recommended at age 78. Per patient this pain has been occurring since prior to the bilateral breast ultrasound. Patient rates the pain at a 5 out of 10 within both breasts. ?  ?Pap Smear: Pap smear not completed today. Last Pap smear was 12/03/2017 at the [facility] clinic and was normal. Per patient has no history of an abnormal Pap smear. Last Pap smear result is not available in Epic. ? ?Physical exam: ?Breasts ?Breasts symmetrical. No skin abnormalities bilateral breasts. No nipple retraction bilateral breasts. No nipple discharge bilateral breasts. No lymphadenopathy. No lumps palpated bilateral breasts. No complaints of pain or tenderness on exam.     ? ?MS DIGITAL SCREENING TOMO BILATERAL ? ?Result Date: 08/29/2020 ?CLINICAL DATA:  Screening. EXAM: DIGITAL SCREENING BILATERAL MAMMOGRAM WITH TOMOSYNTHESIS AND CAD TECHNIQUE: Bilateral screening digital craniocaudal and mediolateral oblique mammograms were obtained. Bilateral screening digital breast tomosynthesis was performed. The images were evaluated with computer-aided detection. COMPARISON:  None. ACR Breast Density Category c: The breast tissue is heterogeneously dense, which may obscure small masses FINDINGS: There are no findings suspicious for malignancy. IMPRESSION: No mammographic evidence of malignancy. A result letter of this screening mammogram will be mailed directly to the patient. RECOMMENDATION: Screening mammogram in one year. (Code:SM-B-01Y) BI-RADS CATEGORY  1: Negative. Electronically Signed   By: Baird Lyons M.D.   On: 08/29/2020 08:57   ?  ?Pelvic/Bimanual ?Pap is due  today. Unable to complete Pap smear today due to patient is currently on menstrual period. Pap smear has been scheduled for Tuesday, Nov 21, 2021 at 0830 at the free cervical cancer screening clinic. ?  ?Smoking History: ?Patient has never smoked. ?  ?Patient Navigation: ?Patient education provided. Access to services provided for patient through [facility] program. Spanish interpreter Viviana Simpler from [facility] provided.  ?  ?Breast and Cervical Cancer Risk Assessment: ?Patient does not have family history of breast cancer, known genetic mutations, or radiation treatment to the chest before age 62. Patient does not have history of cervical dysplasia, immunocompromised, or DES exposure in-utero. ? ?Risk Assessment   ?No risk assessment data for the current encounter ? Risk Scores   ? ?   08/25/2020  ? Last edited by: Meryl Dare, CMA  ? 5-year risk: 0.4 %  ? Lifetime risk: 5.7 %  ? ?  ?  ? ?  ? ? ?A: ?B[facility]CP exam without pap smear ?Complaint of bilateral diffuse breast pain. ? ?P: ?Referred patient to the [facility] for a screening mammogram on the mobile unit. Appointment scheduled Thursday, September 14, 2021 at 1030. ? ?[name], RN ?09/14/2021 9:40 AM   ?

## 2021-09-19 ENCOUNTER — Encounter: Payer: Self-pay | Admitting: Internal Medicine

## 2021-09-19 ENCOUNTER — Other Ambulatory Visit: Payer: Self-pay

## 2021-09-19 ENCOUNTER — Ambulatory Visit: Payer: Self-pay | Admitting: Internal Medicine

## 2021-09-19 VITALS — BP 130/78 | HR 80 | Resp 16 | Ht 63.0 in | Wt 148.0 lb

## 2021-09-19 DIAGNOSIS — Z7689 Persons encountering health services in other specified circumstances: Secondary | ICD-10-CM

## 2021-09-19 DIAGNOSIS — N644 Mastodynia: Secondary | ICD-10-CM

## 2021-09-19 DIAGNOSIS — I10 Essential (primary) hypertension: Secondary | ICD-10-CM

## 2021-09-19 DIAGNOSIS — N6012 Diffuse cystic mastopathy of left breast: Secondary | ICD-10-CM

## 2021-09-19 DIAGNOSIS — Z23 Encounter for immunization: Secondary | ICD-10-CM

## 2021-09-19 DIAGNOSIS — N6011 Diffuse cystic mastopathy of right breast: Secondary | ICD-10-CM

## 2021-09-19 DIAGNOSIS — K029 Dental caries, unspecified: Secondary | ICD-10-CM | POA: Insufficient documentation

## 2021-09-19 MED ORDER — NIFEDIPINE ER OSMOTIC RELEASE 30 MG PO TB24: 30.0000 mg | ORAL_TABLET | Freq: Every day | ORAL | 11 refills | Status: DC

## 2021-09-19 NOTE — Progress Notes (Signed)
Subjective:    Patient ID: Nicole Dodson, female   DOB: [birth year], 46 y.o.   MRN: [id number]   HPI  Here to establish  Tildon Husky interprets   Hypertension:  Diagnosed about 12 years ago.  Has been taking Losartan for about 1 year.  She is not using any form of birth control.  She became pregnant with an IUD in place and so not interested in that    2.  Breast pain:  Pain in bilateral breasts for over a year.  Had mammogram last year and again last week.  Both were normal.  Entire breast bilaterally--perhaps most on lateral sides.  Notes it the week before her period and during period flow, but takes a long time to get better.  Worse when she is stressed.  She has not been drinking or eating anything with caffeine for past 2 month.  May eat chocolate, but not often.  Takes ibuprofen 200 mg and resolves the pain for the entire day.   Later, states the pain has been for years, just worse in past year.    3.  Aching down her legs and arms--feels similar to the breast pain and gets about the same time.  Has been going on for same length of time.   Current Meds  Medication Sig   losartan (COZAAR) 50 MG tablet Take 1 tablet (50 mg total) by mouth daily.   Multiple Vitamins-Minerals (MULTIVITAMIN WITH MINERALS) tablet Take 1 tablet by mouth daily.   No Known Allergies  Past Medical History:  Diagnosis Date   Depression    Pregnancy induced hypertension    Primary hypertension    Past Surgical History:  Procedure Laterality Date   CESAREAN SECTION     x2   Family History  Problem Relation Age of Onset   Hypertension Mother    Hypertension Father    Anxiety disorder Brother    Hyperlipidemia Daughter    Other Daughter        Prediabetes   Breast cancer Neg Hx    Family Status  Relation Name Status   Mother  Alive, age 45y   Father  Alive, age 28y   Sister  Alive   Sister  Alive   Sister  Alive   Sister  Alive   Brother  Alive   Brother  Alive   Brother   Alive   Brother  Alive   Brother  Alive   Daughter [employer], age 18y   Daughter Amy Alive, age 12y   Son Jolyne Laye, age 22y       History of knee injury   Neg Hx  (Not Specified)   Social History   Socioeconomic History   Marital status: Married    Spouse name: Dellia Nims   Number of children: 3   Years of education: Not on file   Highest education level: 6th grade  Occupational History   Occupation: Housecleaning  Tobacco Use   Smoking status: Never   Smokeless tobacco: Never  Vaping Use   Vaping Use: Never used  Substance and Sexual Activity   Alcohol use: No   Drug use: Never   Sexual activity: Yes    Birth control/protection: None  Other Topics Concern   Not on file  Social History Narrative   Lives at home with her husband, their 3 children and her sister in Social worker.   Originally from [place]   Came to [employer]. in 2000   Social Determinants of Health  Financial Resource Strain: Low Risk    Difficulty of Paying Living Expenses: Not hard at all  Food Insecurity: No Food Insecurity   Worried About Programme researcher, broadcasting/film/video in the Last Year: Never true   Ran Out of Food in the Last Year: Never true  Transportation Needs: No Transportation Needs   Lack of Transportation (Medical): No   Lack of Transportation (Non-Medical): No  Physical Activity: Not on file  Stress: Not on file  Social Connections: Not on file  Intimate Partner Violence: Not At Risk   Fear of Current or Ex-Partner: No   Emotionally Abused: No   Physically Abused: No   Sexually Abused: No      Review of Systems    Objective:   BP 130/78 (BP Location: Left Arm, Patient Position: Sitting, Cuff Size: Normal)   Pulse 80   Resp 16   Ht 5\' 3"  (1.6 m)   Wt 148 lb (67.1 kg)   LMP 09/13/2021 (Exact Date)   BMI 26.22 kg/m   Physical Exam NAD HEENT:  PERRL, EOMI, Discs sharp bilaterally.  TMs pearly gray.  Throat without injection.  Dental decay Neck:  Supple, No adenopathy, no thyromegaly Lungs:   CTA Chest:  Breast without focal mass, but  thickened fibrous lumpiness in superiolateral breasts bilaterally and mildly tender in this area.  No skin dimpling, nipple discharge or axillary adenopathy CV:  RRR with normal S1 and S2, No S3, S4 or murmur.  No carotid bruits.  Carotid, radial and DP pulses normal and equal Abd:  S, NT, No HSM or mass, + BS   Assessment & Plan    Hypertension:  controlled with Losartan.  After discussion regarding risk to a fetus should she become pregnant, she would prefer to move to a medication that would be safer should she become pregnant as not using any protection at this time.  Will initiate Procardia XL.  BP check in 1 month  2.  Breast pain:  exam findings and symptoms support fibrocystic breast disease.  Encouraged her to utilize the ibuprofen, which seems to help significantly at low dose.    3.  Dental decay:  referral to dental clinic.  4.  Referral to [facility] program/art and cooking classes.  5.  HM:  Tdap today.  Refuses Bivalent COVID vaccine and receives gynecologic care with [facility] program at [facility].   Return for fasing labs in 1 month:  FLP, CBC, CMP CPE without pap in 4-6 months.

## 2021-10-17 ENCOUNTER — Other Ambulatory Visit: Payer: Self-pay

## 2021-11-21 ENCOUNTER — Other Ambulatory Visit: Payer: Self-pay | Admitting: *Deleted

## 2021-11-21 ENCOUNTER — Encounter (INDEPENDENT_AMBULATORY_CARE_PROVIDER_SITE_OTHER): Payer: Self-pay

## 2021-11-21 DIAGNOSIS — Z124 Encounter for screening for malignant neoplasm of cervix: Secondary | ICD-10-CM

## 2021-11-21 NOTE — Progress Notes (Signed)
Patient: Nicole Dodson           Date of Birth: 09-27-1975           MRN: [id number] Visit Date: 11/21/2021 PCP: Julieanne Manson, MD  Cervical Cancer Screening Do you smoke?: No Have you ever had or been told you have an allergy to latex products?: No Marital status: Married Date of last pap smear: 2-5 yrs ago (12/03/2017-Negative ([facility])) Date of last menstrual period: 11/11/21 Number of pregnancies: 4 Number of births: 3 Have you ever had any of the following? Hysterectomy: No Tubal ligation (tubes tied): No Abnormal bleeding: No Abnormal pap smear: No Venereal warts: No A sex partner with venereal warts: No A high risk* sex partner: No  Cervical Exam  Abnormal Observations: Cervix friable. Recommendations: Last Pap smear was 12/03/2017 at the [facility] clinic and was normal. Per patient has no history of an abnormal Pap smear. Last Pap smear result is not available in Epic. Let patient know if today's Pap smear is normal and HPV negative that her next Pap smear will be due in 5 years. Informed patient will follow up with her within the next couple of weeks with results of Pap smear by phone. Patient verbalized understanding.   Spanish interpreter Natale Lay from [facility] provided.  Patient's History Patient Active Problem List   Diagnosis Date Noted   Bilateral fibrocystic breast disease 09/19/2021   Dental decay 09/19/2021   Primary hypertension 09/23/2020   Breast pain 09/23/2020   Past Medical History:  Diagnosis Date   Depression    Pregnancy induced hypertension    Primary hypertension     Family History  Problem Relation Age of Onset   Hypertension Mother    Hypertension Father    Anxiety disorder Brother    Hyperlipidemia Daughter    Other Daughter        Prediabetes   Breast cancer Neg Hx     Social History   Occupational History   Occupation: Housecleaning  Tobacco Use   Smoking status: Never   Smokeless  tobacco: Never  Vaping Use   Vaping Use: Never used  Substance and Sexual Activity   Alcohol use: No   Drug use: Never   Sexual activity: Yes    Birth control/protection: None

## 2021-11-28 ENCOUNTER — Other Ambulatory Visit: Payer: Self-pay

## 2021-11-28 VITALS — BP 130/80 | HR 68

## 2021-11-28 DIAGNOSIS — Z79899 Other long term (current) drug therapy: Secondary | ICD-10-CM

## 2021-11-28 DIAGNOSIS — Z13220 Encounter for screening for lipoid disorders: Secondary | ICD-10-CM

## 2021-11-28 DIAGNOSIS — Z0130 Encounter for examination of blood pressure without abnormal findings: Secondary | ICD-10-CM

## 2021-11-28 LAB — CYTOLOGY - PAP: Adequacy: ABNORMAL

## 2021-11-28 MED ORDER — NIFEDIPINE ER OSMOTIC RELEASE 30 MG PO TB24: 30.0000 mg | ORAL_TABLET | Freq: Every day | ORAL | 11 refills | Status: DC

## 2021-11-28 NOTE — Progress Notes (Signed)
Patient has been taking Procardia consistently. Patient did not take bp medication this morning as she was fasting for labs.   After reporting bp to Dr Amil Amen, no changes to medication will be made.

## 2021-11-28 NOTE — Progress Notes (Signed)
Needs repeat collection

## 2021-11-29 LAB — CBC WITH DIFFERENTIAL/PLATELET
Basophils Absolute: 0 10*3/uL (ref 0.0–0.2)
Basos: 1 %
EOS (ABSOLUTE): 0.1 10*3/uL (ref 0.0–0.4)
Eos: 2 %
Hematocrit: 36.7 % (ref 34.0–46.6)
Hemoglobin: 11.4 g/dL (ref 11.1–15.9)
Immature Grans (Abs): 0 10*3/uL (ref 0.0–0.1)
Immature Granulocytes: 0 %
Lymphocytes Absolute: 1.9 10*3/uL (ref 0.7–3.1)
Lymphs: 36 %
MCH: 27.1 pg (ref 26.6–33.0)
MCHC: 31.1 g/dL — ABNORMAL LOW (ref 31.5–35.7)
MCV: 87 fL (ref 79–97)
Monocytes Absolute: 0.5 10*3/uL (ref 0.1–0.9)
Monocytes: 8 %
Neutrophils Absolute: 2.9 10*3/uL (ref 1.4–7.0)
Neutrophils: 53 %
Platelets: 251 10*3/uL (ref 150–450)
RBC: 4.2 x10E6/uL (ref 3.77–5.28)
RDW: 13 % (ref 11.7–15.4)
WBC: 5.3 10*3/uL (ref 3.4–10.8)

## 2021-11-29 LAB — COMPREHENSIVE METABOLIC PANEL
ALT: 26 IU/L (ref 0–32)
AST: 26 IU/L (ref 0–40)
Albumin/Globulin Ratio: 1.7 (ref 1.2–2.2)
Albumin: 4.3 g/dL (ref 3.8–4.8)
Alkaline Phosphatase: 69 IU/L (ref 44–121)
BUN/Creatinine Ratio: 26 — ABNORMAL HIGH (ref 9–23)
BUN: 16 mg/dL (ref 6–24)
Bilirubin Total: 0.8 mg/dL (ref 0.0–1.2)
CO2: 22 mmol/L (ref 20–29)
Calcium: 9.1 mg/dL (ref 8.7–10.2)
Chloride: 104 mmol/L (ref 96–106)
Creatinine, Ser: 0.62 mg/dL (ref 0.57–1.00)
Globulin, Total: 2.6 g/dL (ref 1.5–4.5)
Glucose: 98 mg/dL (ref 70–99)
Potassium: 3.7 mmol/L (ref 3.5–5.2)
Sodium: 138 mmol/L (ref 134–144)
Total Protein: 6.9 g/dL (ref 6.0–8.5)
eGFR: 113 mL/min/{1.73_m2} (ref >59)

## 2021-11-29 LAB — LIPID PANEL W/O CHOL/HDL RATIO
Cholesterol, Total: 150 mg/dL (ref 100–199)
HDL: 66 mg/dL (ref >39)
LDL Chol Calc (NIH): 73 mg/dL (ref 0–99)
Triglycerides: 53 mg/dL (ref 0–149)
VLDL Cholesterol Cal: 11 mg/dL (ref 5–40)

## 2021-12-04 ENCOUNTER — Telehealth: Payer: Self-pay

## 2021-12-04 NOTE — Telephone Encounter (Signed)
Via Delorise Royals, Spanish Interpreter [facility]) Patient informed Pap/HPV test needs to be repeated as specimen was unsatisfactory. Patient verbalized understanding, and rescheduled appointment for 02/20/2022 @ 10:30 am.

## 2021-12-20 ENCOUNTER — Ambulatory Visit: Payer: Self-pay | Admitting: Internal Medicine

## 2021-12-20 ENCOUNTER — Encounter: Payer: Self-pay | Admitting: Internal Medicine

## 2021-12-20 VITALS — BP 144/80 | HR 68 | Resp 12 | Ht 63.0 in | Wt 149.0 lb

## 2021-12-20 DIAGNOSIS — N760 Acute vaginitis: Secondary | ICD-10-CM

## 2021-12-20 DIAGNOSIS — I10 Essential (primary) hypertension: Secondary | ICD-10-CM

## 2021-12-20 DIAGNOSIS — L299 Pruritus, unspecified: Secondary | ICD-10-CM

## 2021-12-20 LAB — POCT WET PREP WITH KOH
KOH Prep POC: NEGATIVE
RBC Wet Prep HPF POC: NEGATIVE
Trichomonas, UA: NEGATIVE

## 2021-12-20 MED ORDER — FLUCONAZOLE 150 MG PO TABS: ORAL_TABLET | ORAL | 0 refills | Status: DC

## 2021-12-20 NOTE — Progress Notes (Signed)
    Subjective:    Patient ID: Nicole Dodson, female   DOB: 05-23-1976, 46 y.o.   MRN: [id number]   HPI  Tildon Husky interprets   Vaginal itching for 1.5 weeks ago.  Sometimes with burning as well.  No discharge or odor.  Had something similar months ago, so used 7 day antifungal cream with resolution.  Tried same thing this time without resolution.  Last day of use was yesterday.    2.  Feels like she is itching all over.  Also started 1.5 weeks ago.  Cannot recall utilizing anything new--no new topicals of any kind, shampoo, clothes detergent or softener.  No new medications.  No rash.  3.  Hypertension:  BP a bit high today.   Current Meds  Medication Sig   Glucosamine HCl (GLUCOSAMINE PO) Take by mouth.   Multiple Vitamins-Minerals (MULTIVITAMIN WITH MINERALS) tablet Take 1 tablet by mouth daily.   NIFEdipine (PROCARDIA-XL/NIFEDICAL-XL) 30 MG 24 hr tablet Take 1 tablet (30 mg total) by mouth daily.   No Known Allergies   Review of Systems    Objective:   BP (!) 144/80 (BP Location: Left Arm, Patient Position: Sitting, Cuff Size: Normal)   Pulse 68   Resp 12   Ht 5\' 3"  (1.6 m)   Wt 149 lb (67.6 kg)   LMP 12/02/2021 (Exact Date)   BMI 26.39 kg/m   Physical Exam NAD Skin:  no dryness noted.  No rash. Abd: S, NT, No HSM or mass, + BS GU:  Normal external female genitalia.  Mild inflammation of vagina and cervical mucosa without lesions.  Minimal clear vaginal secretions.  No CMT.  No uterine or adnexal mass or tenderness.     Assessment & Plan    Vaginitis:  Did have clue cells, but no + whiff and had not noted odor or discharge at home.  Did see what appeared to be yeast, though not budding.  Will go ahead and treat with fluconazole 150 mg daily for 2 days.  Call if no improvement beginning of next week.  2.  Generalized itching: CBC, CMP.  To utilize good hydrating cream after bathing.    3.  Hypertension:  BP at home today was 112/70.  To bring in her  home monitor to compare with our monitor here in next 2 weeks.    4.  Discussed labs were all fine end of May.

## 2021-12-21 LAB — COMPREHENSIVE METABOLIC PANEL
ALT: 23 IU/L (ref 0–32)
AST: 22 IU/L (ref 0–40)
Albumin/Globulin Ratio: 1.6 (ref 1.2–2.2)
Albumin: 4.4 g/dL (ref 3.8–4.8)
Alkaline Phosphatase: 70 IU/L (ref 44–121)
BUN/Creatinine Ratio: 28 — ABNORMAL HIGH (ref 9–23)
BUN: 27 mg/dL — ABNORMAL HIGH (ref 6–24)
Bilirubin Total: 0.5 mg/dL (ref 0.0–1.2)
CO2: 18 mmol/L — ABNORMAL LOW (ref 20–29)
Calcium: 9 mg/dL (ref 8.7–10.2)
Chloride: 104 mmol/L (ref 96–106)
Creatinine, Ser: 0.98 mg/dL (ref 0.57–1.00)
Globulin, Total: 2.7 g/dL (ref 1.5–4.5)
Glucose: 107 mg/dL — ABNORMAL HIGH (ref 70–99)
Potassium: 4 mmol/L (ref 3.5–5.2)
Sodium: 140 mmol/L (ref 134–144)
Total Protein: 7.1 g/dL (ref 6.0–8.5)
eGFR: 73 mL/min/{1.73_m2} (ref >59)

## 2021-12-21 LAB — CBC WITH DIFFERENTIAL/PLATELET
Basophils Absolute: 0 10*3/uL (ref 0.0–0.2)
Basos: 0 %
EOS (ABSOLUTE): 0.1 10*3/uL (ref 0.0–0.4)
Eos: 1 %
Hematocrit: 35.4 % (ref 34.0–46.6)
Hemoglobin: 11 g/dL — ABNORMAL LOW (ref 11.1–15.9)
Immature Grans (Abs): 0 10*3/uL (ref 0.0–0.1)
Immature Granulocytes: 0 %
Lymphocytes Absolute: 2.2 10*3/uL (ref 0.7–3.1)
Lymphs: 26 %
MCH: 26.4 pg — ABNORMAL LOW (ref 26.6–33.0)
MCHC: 31.1 g/dL — ABNORMAL LOW (ref 31.5–35.7)
MCV: 85 fL (ref 79–97)
Monocytes Absolute: 0.7 10*3/uL (ref 0.1–0.9)
Monocytes: 9 %
Neutrophils Absolute: 5.5 10*3/uL (ref 1.4–7.0)
Neutrophils: 64 %
Platelets: 263 10*3/uL (ref 150–450)
RBC: 4.17 x10E6/uL (ref 3.77–5.28)
RDW: 13 % (ref 11.7–15.4)
WBC: 8.6 10*3/uL (ref 3.4–10.8)

## 2021-12-22 LAB — GC/CHLAMYDIA PROBE AMP
Chlamydia trachomatis, NAA: NEGATIVE
Neisseria Gonorrhoeae by PCR: NEGATIVE

## 2021-12-26 ENCOUNTER — Telehealth: Payer: Self-pay

## 2021-12-27 NOTE — Telephone Encounter (Signed)
Let her know her labs were okay--appears she is a bit dehydrated, however, and would recommend drinking more fluids like milk or water during the day.  I do not see anything in her labs to explain her itching. Make sure she is using a hydrating cream all over after a shower daily--recommend Eucerin Cream for eczema relief. Also to try Loratadine (generic of Claritin) 10 mg daily and see if helps.

## 2021-12-27 NOTE — Telephone Encounter (Signed)
Patient has been notified of lab results. Patient has also been notified of recommendations. She has been instructed to call if symptoms continues

## 2022-01-11 ENCOUNTER — Other Ambulatory Visit: Payer: Self-pay

## 2022-01-19 ENCOUNTER — Encounter: Payer: Self-pay | Admitting: Internal Medicine

## 2022-01-23 ENCOUNTER — Ambulatory Visit: Payer: Self-pay

## 2022-01-23 VITALS — BP 136/76

## 2022-01-23 DIAGNOSIS — Z0130 Encounter for examination of blood pressure without abnormal findings: Secondary | ICD-10-CM

## 2022-01-23 MED ORDER — NIFEDIPINE ER OSMOTIC RELEASE 60 MG PO TB24: 60.0000 mg | ORAL_TABLET | Freq: Every day | ORAL | 11 refills | Status: DC

## 2022-01-23 NOTE — Progress Notes (Unsigned)
Changed to 60 mg strength of procardia XL

## 2022-01-23 NOTE — Progress Notes (Unsigned)
Has been taking 2 tabs of 30mg  procardia. Wants to know if she can get an order sent for 60mg  tabs because current order only allows for 15 days.

## 2022-02-20 ENCOUNTER — Other Ambulatory Visit: Payer: Self-pay | Admitting: *Deleted

## 2022-02-20 DIAGNOSIS — Z124 Encounter for screening for malignant neoplasm of cervix: Secondary | ICD-10-CM

## 2022-02-20 NOTE — Progress Notes (Signed)
Patient: Nicole Dodson           Date of Birth: 1975/12/25           MRN: [id number] Visit Date: 02/20/2022 PCP: Julieanne Manson, MD  Cervical Cancer Screening Do you smoke?: No Have you ever had or been told you have an allergy to latex products?: No Marital status: Married Date of last pap smear: 2-5 yrs ago (12/03/2017-Negative ([facility])) Date of last menstrual period: 01/29/22 Number of pregnancies: 4 Number of births: 3 Have you ever had any of the following? Hysterectomy: No Tubal ligation (tubes tied): No Abnormal bleeding: No Abnormal pap smear: No Venereal warts: No A sex partner with venereal warts: No A high risk* sex partner: No  Cervical Exam  Abnormal Observations: Cervix Friable. Recommendations: Last Pap smear was 11/21/2021 at the free cervical cancer screening clinic and was unsatisfactory for evaluation. Previous Pap smear was 12/03/2017 at the [facility] clinic and was normal. Per patient has no history of an abnormal Pap smear. Last Pap smear result is available in Epic. Let patient know if today's Pap smear is normal and HPV negative that her next Pap smear will be due in 5 years. Informed patient will follow up with her within the next couple of weeks with results of Pap smear by phone. Patient verbalized understanding.    Spanish interpreter Natale Lay from [facility] provided.  Patient's History Patient Active Problem List   Diagnosis Date Noted   Bilateral fibrocystic breast disease 09/19/2021   Dental decay 09/19/2021   Primary hypertension 09/23/2020   Breast pain 09/23/2020   Past Medical History:  Diagnosis Date   Depression    Pregnancy induced hypertension    Primary hypertension     Family History  Problem Relation Age of Onset   Hypertension Mother    Hypertension Father    Anxiety disorder Brother    Hyperlipidemia Daughter    Other Daughter        Prediabetes   Breast cancer Neg Hx     Social  History   Occupational History   Occupation: Housecleaning  Tobacco Use   Smoking status: Never   Smokeless tobacco: Never  Vaping Use   Vaping Use: Never used  Substance and Sexual Activity   Alcohol use: No   Drug use: Never   Sexual activity: Yes    Birth control/protection: None

## 2022-02-26 LAB — CYTOLOGY - PAP
Comment: NEGATIVE
Diagnosis: NEGATIVE
Diagnosis: REACTIVE
High risk HPV: NEGATIVE

## 2022-02-26 NOTE — Progress Notes (Deleted)
Patient: Nicole Dodson           Date of Birth: 25-Jun-1976           MRN: [id number] Visit Date: 02/20/2022 PCP: Julieanne Manson, MD  Cervical Cancer Screening Do you smoke?: No Have you ever had or been told you have an allergy to latex products?: No Marital status: Married Date of last pap smear: 2-5 yrs ago (12/03/2017-Negative ([facility])) Date of last menstrual period: 01/29/22 Number of pregnancies: 4 Number of births: 3 Have you ever had any of the following? Hysterectomy: No Tubal ligation (tubes tied): No Abnormal bleeding: No Abnormal pap smear: No Venereal warts: No A sex partner with venereal warts: No A high risk* sex partner: No  Cervical Exam Exam not completed.  Patient's History Patient Active Problem List   Diagnosis Date Noted   Bilateral fibrocystic breast disease 09/19/2021   Dental decay 09/19/2021   Primary hypertension 09/23/2020   Breast pain 09/23/2020   Past Medical History:  Diagnosis Date   Depression    Pregnancy induced hypertension    Primary hypertension     Family History  Problem Relation Age of Onset   Hypertension Mother    Hypertension Father    Anxiety disorder Brother    Hyperlipidemia Daughter    Other Daughter        Prediabetes   Breast cancer Neg Hx     Social History   Occupational History   Occupation: Housecleaning  Tobacco Use   Smoking status: Never   Smokeless tobacco: Never  Vaping Use   Vaping Use: Never used  Substance and Sexual Activity   Alcohol use: No   Drug use: Never   Sexual activity: Yes    Birth control/protection: None

## 2022-03-07 ENCOUNTER — Encounter: Payer: Self-pay | Admitting: Internal Medicine

## 2022-03-14 ENCOUNTER — Telehealth: Payer: Self-pay

## 2022-03-14 NOTE — Telephone Encounter (Signed)
03/12/2022- Via Delorise Royals, Spanish Interpreter [facility]), Patient informed negative Pap/HPV results, next pap due in 5 years.

## 2022-06-07 ENCOUNTER — Encounter: Payer: Self-pay | Admitting: Internal Medicine

## 2022-08-30 ENCOUNTER — Ambulatory Visit: Payer: Self-pay | Admitting: Internal Medicine

## 2022-08-30 ENCOUNTER — Encounter: Payer: Self-pay | Admitting: Internal Medicine

## 2022-08-30 VITALS — BP 130/58 | HR 88 | Resp 12 | Ht 61.25 in | Wt 146.5 lb

## 2022-08-30 DIAGNOSIS — Z0000 Encounter for general adult medical examination without abnormal findings: Secondary | ICD-10-CM

## 2022-08-30 DIAGNOSIS — R011 Cardiac murmur, unspecified: Secondary | ICD-10-CM

## 2022-08-30 DIAGNOSIS — F410 Panic disorder [episodic paroxysmal anxiety] without agoraphobia: Secondary | ICD-10-CM

## 2022-08-30 DIAGNOSIS — F419 Anxiety disorder, unspecified: Secondary | ICD-10-CM

## 2022-08-30 DIAGNOSIS — F32A Depression, unspecified: Secondary | ICD-10-CM | POA: Insufficient documentation

## 2022-08-30 DIAGNOSIS — F331 Major depressive disorder, recurrent, moderate: Secondary | ICD-10-CM

## 2022-08-30 DIAGNOSIS — I10 Essential (primary) hypertension: Secondary | ICD-10-CM

## 2022-08-30 DIAGNOSIS — I83893 Varicose veins of bilateral lower extremities with other complications: Secondary | ICD-10-CM | POA: Insufficient documentation

## 2022-08-30 MED ORDER — HYDROCHLOROTHIAZIDE 25 MG PO TABS: 25.0000 mg | ORAL_TABLET | Freq: Every day | ORAL | 11 refills | Status: DC

## 2022-08-30 MED ORDER — ESCITALOPRAM OXALATE 10 MG PO TABS: 10.0000 mg | ORAL_TABLET | Freq: Every day | ORAL | 3 refills | Status: DC

## 2022-08-30 NOTE — Progress Notes (Signed)
Subjective:    Patient ID: Nicole Dodson, female   DOB: [birth year], 47 y.o.   MRN: [id number]   HPI  CPE without pap  1.  Pap:  Last pap with benign reparative changes 01/2022 with [facility].    2.  Mammogram:  Last mammogram through [facility] 09/14/2021.  No family history of breast cancer.    3.  Osteoprevention:  She does drink almond milk, but not enough.  Willing to drink 3-4 times daily.  Runs 20-25 minutes 4 times weekly.    4.  Guaiac Cards/FIT:  Never.    5.  Colonoscopy:  Never.  No family history of colon cancer  6.  Immunizations:  No influenza vaccine this past year.  Had original 2 COVID vaccines, but nothing since.    7.  Glucose/Cholesterol:  A1C and glucose okay in past.  Cholesterol excellent last year in May. Lipid Panel     Component Value Date/Time   CHOL 150 11/28/2021 0920   TRIG 53 11/28/2021 0920   HDL 66 11/28/2021 0920   CHOLHDL 2.5 09/07/2020 1030   LDLCALC 73 11/28/2021 0920   LABVLDL 11 11/28/2021 0920     Current Meds  Medication Sig   Glucosamine HCl (GLUCOSAMINE PO) Take 1 capsule by mouth daily.   Multiple Vitamins-Minerals (MULTIVITAMIN WITH MINERALS) tablet Take 1 tablet by mouth daily.   NIFEdipine (PROCARDIA XL/NIFEDICAL XL) 60 MG 24 hr tablet Take 1 tablet (60 mg total) by mouth daily.   No Known Allergies  Past Medical History:  Diagnosis Date   Anxiety    Depression    Pregnancy induced hypertension    Primary hypertension    Past Surgical History:  Procedure Laterality Date   CESAREAN SECTION     2005, 2010   Family History  Problem Relation Age of Onset   Hypertension Mother    Hypertension Father    Anxiety disorder Brother    Hyperlipidemia Daughter    Other Daughter        Prediabetes   Depression Paternal Grandmother    Breast cancer Neg Hx    Family Status  Relation Name Status   Mother  88, age 59y   Father  64, age 38y   Sister  Alive   Sister  Alive   Sister  Alive   Sister  [place]   Brother  [place]   Brother  [place]   Brother  Alive   Daughter [employer], age 55y   Daughter Amy Alive, age 12y   Son Abbiegail Hampel, age 15y       History of knee injury   PGM  (Not Specified)   Neg Hx  (Not Specified)   Social History   Socioeconomic History   Marital status: Married    Spouse name: Lona Millard   Number of children: 3   Years of education: Not on file   Highest education level: 6th grade  Occupational History   Occupation: Housecleaning  Tobacco Use   Smoking status: Never    Passive exposure: Never   Smokeless tobacco: Never  Vaping Use   Vaping Use: Never used  Substance and Sexual Activity   Alcohol use: No   Drug use: Never   Sexual activity: Yes    Birth control/protection: None  Other Topics Concern   Not on file  Social History Narrative   Lives at home with her husband, their 3 children and her sister  in law.   Originally from [place]   Came to [employer] in 2000   Social Determinants of Health   Financial Resource Strain: Low Risk  (08/30/2022)   Overall Financial Resource Strain (CARDIA)    Difficulty of Paying Living Expenses: Not hard at all  Food Insecurity: No Food Insecurity (08/30/2022)   Hunger Vital Sign    Worried About Running Out of Food in the Last Year: Never true    Ran Out of Food in the Last Year: Never true  Transportation Needs: No Transportation Needs (08/30/2022)   PRAPARE - Hydrologist (Medical): No    Lack of Transportation (Non-Medical): No  Physical Activity: Not on file  Stress: Not on file  Social Connections: Not on file  Intimate Partner Violence: Not At Risk (08/30/2022)   Humiliation, Afraid, Rape, and Kick questionnaire    Fear of Current or Ex-Partner: No    Emotionally Abused: No    Physically Abused: No    Sexually Abused: No      Review of Systems  HENT:  Positive for tinnitus (comes and goes). Negative for dental problem (Dental care to start  tomorrow.  Not through [facility].) and hearing loss.   Eyes:  Negative for visual disturbance.  Respiratory:  Negative for shortness of breath.   Cardiovascular:  Positive for leg swelling (Notes swelling in calf areas in the afternoons.  Does not note in ankles or feet.  Has noted for 3 months.  No associated erythema.  Does have prominence of veins in legs.).  Psychiatric/Behavioral:  Positive for dysphoric mood (Was on medication for about 1 year 10 years ago.  Also received short term counseling.  Both helped and stopped both as was doing well.). The patient is nervous/anxious (Left shoulder pain and perhaps high lateral chest area when goes to bed every night--states she is always stressed and has depression/anxiety.  Describes a pinching and palpitations possibly.  Lasts until she moderates her respiration, which calms her symp).       08/30/2022   11:13 AM  Depression screen PHQ 2/9  Decreased Interest 2  Down, Depressed, Hopeless 2  PHQ - 2 Score 4  Altered sleeping 3  Tired, decreased energy 3  Change in appetite 3  Feeling bad or failure about yourself  3  Trouble concentrating 3  Moving slowly or fidgety/restless 3  Suicidal thoughts 1  PHQ-9 Score 23  Difficult doing work/chores Extremely dIfficult      08/30/2022   11:20 AM  GAD 7 : Generalized Anxiety Score  Nervous, Anxious, on Edge 3  Control/stop worrying 3  Worry too much - different things 3  Trouble relaxing 3  Restless 2  Easily annoyed or irritable 3  Afraid - awful might happen 3  Total GAD 7 Score 20  Anxiety Difficulty Extremely difficult   Describes at least mild panic attacks every night before bed and then larger ones once monthly--feels desperate and her blood is boiling--wants to run away.  The latter last for just a minute or so.      Objective:   BP (!) 130/58 (BP Location: Left Arm, Patient Position: Sitting, Cuff Size: Normal)   Pulse 88   Resp 12   Ht 5' 1.25" (1.556 m)   Wt 146 lb 8 oz (66.5  kg)   LMP 08/04/2022 (Exact Date)   BMI 27.46 kg/m   Physical Exam Chaperone present: Deferred to [facility].  Constitutional:      Appearance:  Normal appearance.  HENT:     Head: Normocephalic and atraumatic.     Right Ear: Tympanic membrane, ear canal and external ear normal.     Left Ear: Tympanic membrane, ear canal and external ear normal.     Nose: Nose normal.     Mouth/Throat:     Mouth: Mucous membranes are moist.     Pharynx: Oropharynx is clear.  Eyes:     Extraocular Movements: Extraocular movements intact.     Conjunctiva/sclera: Conjunctivae normal.     Pupils: Pupils are equal, round, and reactive to light.     Comments: Discs sharp  Neck:     Thyroid: No thyroid mass or thyromegaly.  Cardiovascular:     Rate and Rhythm: Normal rate and regular rhythm.     Heart sounds: Murmur heard.     Systolic (Heard best in high left subclavian area, but also along left sternal border, less so at apex.  Do not hear it radiating to carotids.) murmur is present with a grade of 2/6.     No friction rub. No S3 or S4 sounds.     Comments: No carotid bruits.  Carotid, radial, femoral, DP and PT pulses normal and equal.   Pulmonary:     Effort: Pulmonary effort is normal.     Breath sounds: Normal breath sounds and air entry.  Abdominal:     General: Bowel sounds are normal.     Palpations: Abdomen is soft. There is no hepatomegaly, splenomegaly or mass.     Tenderness: There is no abdominal tenderness.     Hernia: No hernia is present.  Genitourinary:    Comments: Deferred to [facility] Musculoskeletal:        General: Normal range of motion.     Cervical back: Normal range of motion and neck supple.     Right lower leg: No edema.     Left lower leg: No edema.  Lymphadenopathy:     Head:     Right side of head: No submental or submandibular adenopathy.     Left side of head: No submental or submandibular adenopathy.     Cervical: No cervical adenopathy.     Upper Body:     Right  upper body: No supraclavicular adenopathy.     Left upper body: No supraclavicular adenopathy.     Lower Body: No right inguinal adenopathy. No left inguinal adenopathy.  Skin:    General: Skin is warm.     Capillary Refill: Capillary refill takes less than 2 seconds.     Findings: No rash.  Neurological:     General: No focal deficit present.     Mental Status: She is alert and oriented to person, place, and time.     Cranial Nerves: Cranial nerves 2-12 are intact.     Sensory: Sensation is intact.     Motor: Motor function is intact.     Coordination: Coordination is intact.     Gait: Gait is intact.     Deep Tendon Reflexes: Reflexes are normal and symmetric.  Psychiatric:        Mood and Affect: Affect is tearful (mild at times).        Speech: Speech normal.        Behavior: Behavior normal. Behavior is cooperative.      Assessment & Plan    CPE without pap Mammogram through [facility] will notify her. FIT tor return when in for labs in 2 weeks. FLP, CBC, CMP,  possible Spikevax and ECG in 2 weeks.  2.  Recurrent depression/anxiety/panic disorder:  Start Escitalopram 10 mg daily.  Referral to [place] for counseling.  Follow up with me for quick check in 2 weeks.  3.  Heart murmur:  not able to localize well.  Return for ECG as unable to perform today.  Cardiology referral for ?echo.    4.  Hypertension:  controlled, but having LE edema.  She is still having regular periods and unwilling to consider any form of birth control.  Switch to HCTZ 25 mg daily and discontinue Nifedipine.  Electrolytes and BP check in 2 week.  5.  Leg swelling:  suspect due to Nifedipine, though also with mild varicosities.  Encouraged her to elevate legs in a reclining position in afternoon.  Discussed if does not improve with discontinuation of medication and elevation, to consider thigh high compression stockings.

## 2022-09-10 ENCOUNTER — Other Ambulatory Visit: Payer: Self-pay

## 2022-09-11 ENCOUNTER — Telehealth: Payer: Self-pay

## 2022-09-11 NOTE — Telephone Encounter (Signed)
Telephoned patient at mobile number using interpreter [phone]. Voice mail unavailable at this time.

## 2022-09-13 ENCOUNTER — Encounter: Payer: Self-pay | Admitting: Internal Medicine

## 2022-09-13 ENCOUNTER — Ambulatory Visit: Payer: Self-pay | Admitting: Internal Medicine

## 2022-09-13 VITALS — BP 150/82 | HR 76 | Resp 16 | Ht 61.25 in

## 2022-09-13 DIAGNOSIS — R011 Cardiac murmur, unspecified: Secondary | ICD-10-CM

## 2022-09-13 DIAGNOSIS — Z0000 Encounter for general adult medical examination without abnormal findings: Secondary | ICD-10-CM

## 2022-09-13 DIAGNOSIS — I10 Essential (primary) hypertension: Secondary | ICD-10-CM

## 2022-09-13 DIAGNOSIS — R609 Edema, unspecified: Secondary | ICD-10-CM

## 2022-09-13 DIAGNOSIS — D509 Iron deficiency anemia, unspecified: Secondary | ICD-10-CM

## 2022-09-13 DIAGNOSIS — Z13220 Encounter for screening for lipoid disorders: Secondary | ICD-10-CM

## 2022-09-13 DIAGNOSIS — F419 Anxiety disorder, unspecified: Secondary | ICD-10-CM

## 2022-09-13 DIAGNOSIS — F331 Major depressive disorder, recurrent, moderate: Secondary | ICD-10-CM

## 2022-09-13 DIAGNOSIS — F410 Panic disorder [episodic paroxysmal anxiety] without agoraphobia: Secondary | ICD-10-CM

## 2022-09-13 MED ORDER — LOSARTAN POTASSIUM-HCTZ 50-12.5 MG PO TABS: 1.0000 | ORAL_TABLET | Freq: Every day | ORAL | 11 refills | Status: DC

## 2022-09-13 NOTE — Patient Instructions (Signed)
Stop hydrochlorothiazide 25 mg daily.

## 2022-09-13 NOTE — Progress Notes (Signed)
    Subjective:    Patient ID: Nicole Dodson, female   DOB: [birth year], 47 y.o.   MRN: [id number]   HPI  Isidore Moos interprets   LE edema:  Has resolved with removal of nifedipine and initiation of HCTZ.    2.  Hypertension:  she is not using birth control and still having regular periods.  She is willing at this point to have her husband use condoms with intercourse regularly.  She was controlled with Losartan 50 mg previously, but was unwilling to use anything to prevent pregnancy at the time in 2022.    3.  Heart murmur:  ECG today is normal.  She has an appt with Dr. Gasper Sells, cardiology 10/01/2022.    4.  Anxiety/panic disorder/depression:  Has been taking Escitalopram 10 mg daily for about 2 weeks and states no longer having panic attacks in the evenings.  Describes some generalized anxiety.  No suicidal thoughts  Current Meds  Medication Sig   escitalopram (LEXAPRO) 10 MG tablet Take 1 tablet (10 mg total) by mouth daily.   Glucosamine HCl (GLUCOSAMINE PO) Take 1 capsule by mouth daily.   hydrochlorothiazide (HYDRODIURIL) 25 MG tablet Take 1 tablet (25 mg total) by mouth daily.   Multiple Vitamins-Minerals (MULTIVITAMIN WITH MINERALS) tablet Take 1 tablet by mouth daily.   No Known Allergies   Review of Systems    Objective:   BP (!) 150/82 (BP Location: Left Arm, Patient Position: Sitting, Cuff Size: Normal)   Pulse 76   Resp 16   Ht 5' 1.25" (1.556 m)   LMP 08/31/2022 (Exact Date)   BMI 27.46 kg/m   Physical Exam NAD Lungs:  CTA CV:  RRR with grade II-III/VI SEM,  noted most prominently along LSB.  Not clear if improves with valsalva.  Radial and DP pulses normal and equal LE:  No edema.  ECG normal.  Assessment & Plan   Anxiety/panic disorder/depression:  already with some improvement with start of escitalopram.  Follow up in 4 weeks.  2.  Hypertension:  not controlled.  Switch to Losartan/HCTZ 50/12/5 mg.  CMP today.  Screen for  hypercholesterolemia.  3.  Peripheral edema:  resolved.  Avoid nifedipine in future.  With diuretic, check CMP.  Also, CBC  4.  Heart murmur:  cardiology eval-would like to see echo to evaluate.

## 2022-09-14 ENCOUNTER — Encounter: Payer: Self-pay | Admitting: Internal Medicine

## 2022-09-14 DIAGNOSIS — O139 Gestational [pregnancy-induced] hypertension without significant proteinuria, unspecified trimester: Secondary | ICD-10-CM | POA: Insufficient documentation

## 2022-09-14 DIAGNOSIS — D649 Anemia, unspecified: Secondary | ICD-10-CM | POA: Insufficient documentation

## 2022-09-14 LAB — LIPID PANEL W/O CHOL/HDL RATIO
Cholesterol, Total: 169 mg/dL (ref 100–199)
HDL: 72 mg/dL (ref >39)
LDL Chol Calc (NIH): 87 mg/dL (ref 0–99)
Triglycerides: 47 mg/dL (ref 0–149)
VLDL Cholesterol Cal: 10 mg/dL (ref 5–40)

## 2022-09-14 LAB — CBC WITH DIFFERENTIAL/PLATELET
Basophils Absolute: 0 10*3/uL (ref 0.0–0.2)
Basos: 1 %
EOS (ABSOLUTE): 0 10*3/uL (ref 0.0–0.4)
Eos: 1 %
Hematocrit: 33.8 % — ABNORMAL LOW (ref 34.0–46.6)
Hemoglobin: 10.5 g/dL — ABNORMAL LOW (ref 11.1–15.9)
Immature Grans (Abs): 0 10*3/uL (ref 0.0–0.1)
Immature Granulocytes: 0 %
Lymphocytes Absolute: 1.8 10*3/uL (ref 0.7–3.1)
Lymphs: 32 %
MCH: 24.9 pg — ABNORMAL LOW (ref 26.6–33.0)
MCHC: 31.1 g/dL — ABNORMAL LOW (ref 31.5–35.7)
MCV: 80 fL (ref 79–97)
Monocytes Absolute: 0.4 10*3/uL (ref 0.1–0.9)
Monocytes: 8 %
Neutrophils Absolute: 3.3 10*3/uL (ref 1.4–7.0)
Neutrophils: 58 %
Platelets: 275 10*3/uL (ref 150–450)
RBC: 4.21 x10E6/uL (ref 3.77–5.28)
RDW: 14.6 % (ref 11.7–15.4)
WBC: 5.6 10*3/uL (ref 3.4–10.8)

## 2022-09-14 LAB — COMPREHENSIVE METABOLIC PANEL
ALT: 18 IU/L (ref 0–32)
AST: 20 IU/L (ref 0–40)
Albumin/Globulin Ratio: 1.7 (ref 1.2–2.2)
Albumin: 4.3 g/dL (ref 3.9–4.9)
Alkaline Phosphatase: 80 IU/L (ref 44–121)
BUN/Creatinine Ratio: 25 — ABNORMAL HIGH (ref 9–23)
BUN: 20 mg/dL (ref 6–24)
Bilirubin Total: 0.9 mg/dL (ref 0.0–1.2)
CO2: 26 mmol/L (ref 20–29)
Calcium: 9.5 mg/dL (ref 8.7–10.2)
Chloride: 100 mmol/L (ref 96–106)
Creatinine, Ser: 0.81 mg/dL (ref 0.57–1.00)
Globulin, Total: 2.6 g/dL (ref 1.5–4.5)
Glucose: 86 mg/dL (ref 70–99)
Potassium: 3.8 mmol/L (ref 3.5–5.2)
Sodium: 141 mmol/L (ref 134–144)
Total Protein: 6.9 g/dL (ref 6.0–8.5)
eGFR: 91 mL/min/{1.73_m2} (ref >59)

## 2022-09-15 LAB — IRON AND TIBC
Iron Saturation: 11 % — ABNORMAL LOW (ref 15–55)
Iron: 47 ug/dL (ref 27–159)
Total Iron Binding Capacity: 412 ug/dL (ref 250–450)
UIBC: 365 ug/dL (ref 131–425)

## 2022-09-15 LAB — SPECIMEN STATUS REPORT

## 2022-09-15 LAB — VITAMIN B12: Vitamin B-12: 695 pg/mL (ref 232–1245)

## 2022-09-15 LAB — FOLATE: Folate: >20 ng/mL (ref >3.0)

## 2022-09-17 DIAGNOSIS — D509 Iron deficiency anemia, unspecified: Secondary | ICD-10-CM | POA: Insufficient documentation

## 2022-09-17 DIAGNOSIS — R609 Edema, unspecified: Secondary | ICD-10-CM | POA: Insufficient documentation

## 2022-09-17 DIAGNOSIS — R600 Localized edema: Secondary | ICD-10-CM | POA: Insufficient documentation

## 2022-09-17 DIAGNOSIS — F410 Panic disorder [episodic paroxysmal anxiety] without agoraphobia: Secondary | ICD-10-CM | POA: Insufficient documentation

## 2022-09-17 MED ORDER — FERROUS GLUCONATE 324 (38 FE) MG PO TABS: 324.0000 mg | ORAL_TABLET | Freq: Every day | ORAL | 3 refills | Status: AC

## 2022-09-17 NOTE — Addendum Note (Signed)
Addended by: Marcelino Duster on: 09/17/2022 10:14 AM   Modules accepted: Orders

## 2022-09-25 ENCOUNTER — Other Ambulatory Visit: Payer: Self-pay | Admitting: Psychology

## 2022-09-27 NOTE — Telephone Encounter (Signed)
Telephoned patient,using interpreter [phone], patient requested mammogram scholarship application two weeks ago. Told patient I will mail mammogram scholarship application.

## 2022-10-01 ENCOUNTER — Ambulatory Visit: Payer: Self-pay | Attending: Internal Medicine | Admitting: Internal Medicine

## 2022-10-01 ENCOUNTER — Encounter: Payer: Self-pay | Admitting: Internal Medicine

## 2022-10-01 VITALS — BP 142/70 | HR 73 | Ht 61.0 in | Wt 150.4 lb

## 2022-10-01 DIAGNOSIS — R011 Cardiac murmur, unspecified: Secondary | ICD-10-CM

## 2022-10-01 DIAGNOSIS — R600 Localized edema: Secondary | ICD-10-CM

## 2022-10-01 DIAGNOSIS — I10 Essential (primary) hypertension: Secondary | ICD-10-CM

## 2022-10-01 MED ORDER — HYDROCHLOROTHIAZIDE 12.5 MG PO CAPS: 12.5000 mg | ORAL_CAPSULE | Freq: Every day | ORAL | 3 refills | Status: DC

## 2022-10-01 NOTE — Progress Notes (Signed)
Cardiology Office Note:    Date:  10/01/2022   ID:  Nicole Dodson, DOB Feb 17, 1976, MRN [id number]  PCP:  Mack Hook, [facility] Cardiologist:  Werner Lean, MD     Referring MD: Mack Hook, MD   CC: Told to come here Consulted for the evaluation of murmur at the behest of Dr. Amil Amen  History of Present Illness:    Nicole Dodson is a 47 y.o. female with a hx of HTN with CCB associated LE edema and pregnancy induced HTN, heart murmur NOS.  Patient notes that she is feeling great.    Has had no chest pain, chest pressure, chest tightness, chest stinging. Gets up early in the morning and is gets his son and husband ready.  Daughter goes to school and helps her get ready as well.  Takes care of her house with no symptoms. She exercises- running outside with no symptoms.  No shortness of breath, DOE .  No PND or orthopnea.  No weight gain, leg swelling , or abdominal swelling.  No syncope or near syncope . Notes  no palpitations or funny heart beats.     Ambulatory BP: 136/72  Past Medical History:  Diagnosis Date   Anxiety    Depression    Normocytic anemia    Pregnancy induced hypertension    Primary hypertension     Past Surgical History:  Procedure Laterality Date   CESAREAN SECTION     2005, 2010    Current Medications: Current Meds  Medication Sig   escitalopram (LEXAPRO) 10 MG tablet Take 1 tablet (10 mg total) by mouth daily.   ferrous gluconate (FERGON) 324 MG tablet Take 1 tablet (324 mg total) by mouth daily.   Glucosamine HCl (GLUCOSAMINE PO) Take 1 capsule by mouth daily.   hydrochlorothiazide (MICROZIDE) 12.5 MG capsule Take 1 capsule (12.5 mg total) by mouth daily.   losartan-hydrochlorothiazide (HYZAAR) 50-12.5 MG tablet Take 1 tablet by mouth daily.   Multiple Vitamins-Minerals (MULTIVITAMIN WITH MINERALS) tablet Take 1 tablet by mouth daily.     Allergies:   Patient has no  known allergies.   Social History   Socioeconomic History   Marital status: Married    Spouse name: Nicole Dodson   Number of children: 3   Years of education: Not on file   Highest education level: 6th grade  Occupational History   Occupation: Housecleaning  Tobacco Use   Smoking status: Never    Passive exposure: Never   Smokeless tobacco: Never  Vaping Use   Vaping Use: Never used  Substance and Sexual Activity   Alcohol use: No   Drug use: Never   Sexual activity: Yes    Birth control/protection: None  Other Topics Concern   Not on file  Social History Narrative   Lives at home with her husband, their 3 children and her sister in [employer].   Originally from [place]   Came to [employer]. in 2000   Social Determinants of Health   Financial Resource Strain: Low Risk  (08/30/2022)   Overall Financial Resource Strain (CARDIA)    Difficulty of Paying Living Expenses: Not hard at all  Food Insecurity: No Food Insecurity (08/30/2022)   Hunger Vital Sign    Worried About Running Out of Food in the Last Year: Never true    Ran Out of Food in the Last Year: Never true  Transportation Needs: No Transportation Needs (08/30/2022)   PRAPARE - Transportation    Lack of  Transportation (Medical): No    Lack of Transportation (Non-Medical): No  Physical Activity: Not on file  Stress: Not on file  Social Connections: Not on file     Family History: The patient's family history includes Anxiety disorder in her brother; Depression in her paternal grandmother; Hyperlipidemia in her daughter; Hypertension in her father and mother; Other in her daughter. There is no history of Breast cancer.  ROS:   Please see the history of present illness.     All other systems reviewed and are negative.  EKGs/Labs/Other Studies Reviewed:    The following studies were reviewed today:  EKG:  EKG is  ordered today.  The ekg ordered today demonstrates  10/01/22: SR rate 73     Recent Labs: 09/13/2022: ALT 18; BUN  20; Creatinine, Ser 0.81; Hemoglobin 10.5; Platelets 275; Potassium 3.8; Sodium 141  Recent Lipid Panel    Component Value Date/Time   CHOL 169 09/13/2022 1155   TRIG 47 09/13/2022 1155   HDL 72 09/13/2022 1155   CHOLHDL 2.5 09/07/2020 1030   LDLCALC 87 09/13/2022 1155    Risk Assessment/Calculations:    HYPERTENSION CONTROL Vitals:   10/01/22 0800 10/01/22 0817  BP: (!) 140/68 (!) 142/70    The patient's blood pressure is elevated above target today.  In order to address the patient's elevated BP: A new medication was prescribed today.           Physical Exam:    VS:  BP (!) 142/70   Pulse 73   Ht 5\' 1"  (1.549 m)   Wt 150 lb 6.4 oz (68.2 kg)   LMP 08/31/2022 (Exact Date)   SpO2 99%   BMI 28.42 kg/m     Wt Readings from Last 3 Encounters:  10/01/22 150 lb 6.4 oz (68.2 kg)  08/30/22 146 lb 8 oz (66.5 kg)  12/20/21 149 lb (67.6 kg)     GEN:  Well nourished, well developed in no acute distress HEENT: Normal NECK: No JVD CARDIAC: RRR, no rubs, gallops, II/VI holosystolic murmur RESPIRATORY:  Clear to auscultation without rales, wheezing or rhonchi  ABDOMEN: Soft, non-tender, non-distended MUSCULOSKELETAL:  No edema; No deformity  SKIN: Warm and dry NEUROLOGIC:  Alert and oriented x 3 PSYCHIATRIC:  Normal affect   ASSESSMENT:    1. Heart murmur   2. Essential hypertension   3. Leg edema    PLAN:    Heart murmur - will get echo   HTN - increase regimen by adding HCTZ to 12.5 Bmp at time of echo - continue AM BP monitoring  LE edema - none of CCB  PRN unless significant heart disease      Medication Adjustments/Labs and Tests Ordered: Current medicines are reviewed at length with the patient today.  Concerns regarding medicines are outlined above.  Orders Placed This Encounter  Procedures   Basic metabolic panel   EKG [id number]   ECHOCARDIOGRAM COMPLETE   Meds ordered this encounter  Medications   hydrochlorothiazide (MICROZIDE) 12.5 MG  capsule    Sig: Take 1 capsule (12.5 mg total) by mouth daily.    Dispense:  90 capsule    Refill:  3    Patient Instructions  Medication Instructions:  Your physician has recommended you make the following change in your medication:  START:hydrochlorothiazide (HCTZ) 12.5 mg by mouth once daily  *If you need a refill on your cardiac medications before your next appointment, please call your pharmacy*   Lab Work: IN 2 WEEKS: BMP  If you have labs (blood work) drawn today and your tests are completely normal, you will receive your results only by: [place] (if you have MyChart) OR A paper copy in the mail If you have any lab test that is abnormal or we need to change your treatment, we will call you to review the results.   Testing/Procedures: Your physician has requested that you have an echocardiogram. Echocardiography is a painless test that uses sound waves to create images of your heart. It provides your doctor with information about the size and shape of your heart and how well your heart's chambers and valves are working. This procedure takes approximately one hour. There are no restrictions for this procedure. Please do NOT wear cologne, perfume, aftershave, or lotions (deodorant is allowed). Please arrive 15 minutes prior to your appointment time.    Follow-Up: As needed At [facility], you and your health needs are our priority.  As part of our continuing mission to provide you with exceptional heart care, we have created designated Provider Care Teams.  These Care Teams include your primary Cardiologist (physician) and Advanced Practice Providers (APPs -  Physician Assistants and Nurse Practitioners) who all work together to provide you with the care you need, when you need it.  We recommend signing up for the patient portal called "MyChart".  Sign up information is provided on this After Visit Summary.  MyChart is used to connect with patients for Virtual  Visits (Telemedicine).  Patients are able to view lab/test results, encounter notes, upcoming appointments, etc.  Non-urgent messages can be sent to your provider as well.   To learn more about what you can do with MyChart, go to [email or web address].     Provider:   Werner Lean, MD       Signed, Werner Lean, MD  10/01/2022 8:34 AM    [place]

## 2022-10-01 NOTE — Patient Instructions (Signed)
Medication Instructions:  Your physician has recommended you make the following change in your medication:  START:hydrochlorothiazide (HCTZ) 12.5 mg by mouth once daily  *If you need a refill on your cardiac medications before your next appointment, please call your pharmacy*   Lab Work: IN 2 WEEKS: BMP  If you have labs (blood work) drawn today and your tests are completely normal, you will receive your results only by: [place] (if you have MyChart) OR A paper copy in the mail If you have any lab test that is abnormal or we need to change your treatment, we will call you to review the results.   Testing/Procedures: Your physician has requested that you have an echocardiogram. Echocardiography is a painless test that uses sound waves to create images of your heart. It provides your doctor with information about the size and shape of your heart and how well your heart's chambers and valves are working. This procedure takes approximately one hour. There are no restrictions for this procedure. Please do NOT wear cologne, perfume, aftershave, or lotions (deodorant is allowed). Please arrive 15 minutes prior to your appointment time.    Follow-Up: As needed At [facility], you and your health needs are our priority.  As part of our continuing mission to provide you with exceptional heart care, we have created designated Provider Care Teams.  These Care Teams include your primary Cardiologist (physician) and Advanced Practice Providers (APPs -  Physician Assistants and Nurse Practitioners) who all work together to provide you with the care you need, when you need it.  We recommend signing up for the patient portal called "MyChart".  Sign up information is provided on this After Visit Summary.  MyChart is used to connect with patients for Virtual Visits (Telemedicine).  Patients are able to view lab/test results, encounter notes, upcoming appointments, etc.  Non-urgent messages  can be sent to your provider as well.   To learn more about what you can do with MyChart, go to [email or web address].     Provider:   Werner Lean, MD

## 2022-10-04 ENCOUNTER — Other Ambulatory Visit: Payer: Self-pay | Admitting: Psychology

## 2022-10-08 ENCOUNTER — Telehealth: Payer: Self-pay

## 2022-10-08 NOTE — Telephone Encounter (Signed)
Patient needs Covid, tdap, hep b vaccine for her immigration application. Would like to know if she can receive them here.  Her husband Judithe Modest (DOB 07/25/1971) needs the same vaccines and would like to know if he could also get them. He is not a patient of ours and does not currently have a PCP

## 2022-10-11 ENCOUNTER — Ambulatory Visit: Payer: Self-pay | Admitting: Psychology

## 2022-10-11 ENCOUNTER — Other Ambulatory Visit (INDEPENDENT_AMBULATORY_CARE_PROVIDER_SITE_OTHER): Payer: Self-pay

## 2022-10-11 DIAGNOSIS — F419 Anxiety disorder, unspecified: Secondary | ICD-10-CM

## 2022-10-11 DIAGNOSIS — Z1211 Encounter for screening for malignant neoplasm of colon: Secondary | ICD-10-CM

## 2022-10-11 LAB — POC FIT TEST STOOL: Fecal Occult Blood: NEGATIVE

## 2022-10-13 NOTE — Progress Notes (Signed)
SOAP Notes Session Summary Provider / Clinician's Name: Letta Moynahan Darreld Mclean, [place]  Client Name: Nicole Dodson  Date of Service: 10/11/2022 Duration: 60 mins.  Subjective: Client reported that she feels a lot of stress on a regular basis. She talked about how she is primary caregiver for the house, the children, her spouse, and sometimes even helping her husband with his business. She stated, "I get up everyday at 5:30 a.m. I make breakfast and get my daughter up. I help her get ready for school. I take her to the bus. I come back I make breakfast for my husband and son. They go to work. I try to clean up some and then I try to exercise a little. Then I need to shower. I prepare more food and take it to my husband and son for lunch. Then I come back and get my daughter off the bus, get her a snack, and help her with homework. Then its time to prepare dinner and then clean up. Then my husband gets home late and we eat and then get the kids ready for bed. Then I help my husband prepare for anything that he might need for tomorrow. Then I prep a few things for tomorrow as well and then it is time to go to bed. After that I go to bed and I get up and do it all again." She talked about how it causes her so much stress because everything for everyone is always on her.  Objective: Sanita takes care of everyone else but does not spend any time taking care of herself. I did tell her that I was proud of her for at least taking some time each day to exercise but pointed out that she takes no time to do something that she would enjoy and that she is not doing any self care either. She leads a very busy life care giving for everyone in the house. No one is showing her the same level of care making sure that she has everything that she needs or even asking to lend a hand, give her a night off from cooking, or helping her to clean. Her husband works 6 days per week and on the one day that he has off they  spend the majority of the day at church and then preparing for the week.  She also has to take the kids to doctor appointments and extra curricular activities. She did report that she feels like the medicine that she is on, is helping her just feel a little less stressed. She was happy about that.  Assessment: It seemed as though [place] responded well to therapy. It took her a little bit to open up to me and feel comfortable talking about her life and her stress. Once she did, it seemed as though she enjoyed being able to talk it out and that I understood her. I spent time listening and acknowledging all that she went through on a daily basis. I normalized this idea that women should be able to do all of these roles and never feel like it is a burden on them. I think she responded well to knowing that someone understood what she was going through. I talked to her about the importance of her taking care of herself and making sure that she had some time to herself. I explained that she can not take care of others properly if she was not taking care of herself. I also made sure that she realized  that everything she was doing was no different than her working and encouraged her to reach out to her family and see if they would be willing to help her even with just a few things.  Plan: Bi-weekly therapy sessions as a safe place to get out feelings and just have someone to talk to. Complete a Comprehensive Clinical Assessment on next visit. Asked the client to take 5 minutes out of every day to do something for herself until she sees me again.

## 2022-10-15 ENCOUNTER — Ambulatory Visit: Payer: Self-pay | Attending: Internal Medicine

## 2022-10-15 DIAGNOSIS — R011 Cardiac murmur, unspecified: Secondary | ICD-10-CM

## 2022-10-15 DIAGNOSIS — I10 Essential (primary) hypertension: Secondary | ICD-10-CM

## 2022-10-15 DIAGNOSIS — R600 Localized edema: Secondary | ICD-10-CM

## 2022-10-16 LAB — BASIC METABOLIC PANEL
BUN/Creatinine Ratio: 24 — ABNORMAL HIGH (ref 9–23)
BUN: 16 mg/dL (ref 6–24)
CO2: 24 mmol/L (ref 20–29)
Calcium: 9.5 mg/dL (ref 8.7–10.2)
Chloride: 104 mmol/L (ref 96–106)
Creatinine, Ser: 0.66 mg/dL (ref 0.57–1.00)
Glucose: 78 mg/dL (ref 70–99)
Potassium: 4.2 mmol/L (ref 3.5–5.2)
Sodium: 141 mmol/L (ref 134–144)
eGFR: 110 mL/min/{1.73_m2} (ref >59)

## 2022-10-16 NOTE — Telephone Encounter (Signed)
Patient has been scheduled for vaccines.

## 2022-10-17 ENCOUNTER — Other Ambulatory Visit: Payer: Self-pay | Admitting: Internal Medicine

## 2022-10-17 DIAGNOSIS — Z1231 Encounter for screening mammogram for malignant neoplasm of breast: Secondary | ICD-10-CM

## 2022-10-23 ENCOUNTER — Ambulatory Visit: Payer: Self-pay | Admitting: Internal Medicine

## 2022-10-23 NOTE — Addendum Note (Signed)
Addended by: Marcene Duos on: 10/23/2022 03:15 PM   Modules accepted: Orders

## 2022-10-29 ENCOUNTER — Ambulatory Visit (HOSPITAL_COMMUNITY): Payer: Self-pay | Attending: Cardiovascular Disease

## 2022-10-29 DIAGNOSIS — I10 Essential (primary) hypertension: Secondary | ICD-10-CM | POA: Insufficient documentation

## 2022-10-29 DIAGNOSIS — R600 Localized edema: Secondary | ICD-10-CM | POA: Insufficient documentation

## 2022-10-29 DIAGNOSIS — R011 Cardiac murmur, unspecified: Secondary | ICD-10-CM | POA: Insufficient documentation

## 2022-10-29 LAB — ECHOCARDIOGRAM COMPLETE
Area-P 1/2: 3.65 cm2
S' Lateral: 2.8 cm

## 2022-11-06 ENCOUNTER — Ambulatory Visit: Payer: Self-pay | Admitting: Internal Medicine

## 2022-11-06 VITALS — BP 138/82 | HR 80 | Resp 16 | Ht 61.0 in | Wt 153.0 lb

## 2022-11-06 DIAGNOSIS — F419 Anxiety disorder, unspecified: Secondary | ICD-10-CM

## 2022-11-06 DIAGNOSIS — F331 Major depressive disorder, recurrent, moderate: Secondary | ICD-10-CM

## 2022-11-06 DIAGNOSIS — I10 Essential (primary) hypertension: Secondary | ICD-10-CM

## 2022-11-06 DIAGNOSIS — D509 Iron deficiency anemia, unspecified: Secondary | ICD-10-CM

## 2022-11-06 DIAGNOSIS — F410 Panic disorder [episodic paroxysmal anxiety] without agoraphobia: Secondary | ICD-10-CM

## 2022-11-06 MED ORDER — ESCITALOPRAM OXALATE 10 MG PO TABS: 10.0000 mg | ORAL_TABLET | Freq: Every day | ORAL | 11 refills | Status: DC

## 2022-11-06 NOTE — Progress Notes (Unsigned)
    Subjective:    Patient ID: Nicole Dodson, female   DOB: [birth year], 47 y.o.   MRN: [id number]   HPI  Nicole Dodson interprets   Depression/Anxiety and panic disorder:  Tolerating Escitalopram well and feels she has not had a panic attack since last here in March.  She has more motivation to do more.  No thoughts of suicide.   2.  Hypertension:  tolerating losartan/HCTZ 50/12.5 mg daily.    3.  Iron deficiency Anemia:  Her periods are not heavy and last 4 days.  FIT was negative last month.  She is 47 years of age.  She started iron supplementation 1 month ago.    4.  Heart murmur:  Echo end of last month normal with cardiology.    Current Meds  Medication Sig   escitalopram (LEXAPRO) 10 MG tablet Take 1 tablet (10 mg total) by mouth daily.   ferrous gluconate (FERGON) 324 MG tablet Take 1 tablet (324 mg total) by mouth daily.   losartan-hydrochlorothiazide (HYZAAR) 50-12.5 MG tablet Take 1 tablet by mouth daily.   Multiple Vitamins-Minerals (MULTIVITAMIN WITH MINERALS) tablet Take 1 tablet by mouth daily.   No Known Allergies   Review of Systems    Objective:   BP 138/82 (BP Location: Left Arm, Patient Position: Sitting, Cuff Size: Normal)   Pulse 80   Resp 16   Ht 5\' 1"  (1.549 m)   Wt 153 lb (69.4 kg)   BMI 28.91 kg/m   Physical Exam NAD Lungs;  CTA CV:  RRR with 2/6 systolic murmur unchanged.   Abd:  S, NT, + BS, No HSM or mass. LE:  No edema.   Assessment & Plan   Depression/anxiety/ panic disorder:  Much improved control.  Continue Escitalopram.  2.  Hypertension:  improved control.  Suspect control of anxiety also an element.  3.  Iron deficiency anemia:  Has repeat CBC in another month after iron replacement.  FIT negative, but no good history of anemia.  Referral to GI for diagnostic colonoscopy.  Will need to apply for [facility] financial assistance.

## 2022-11-07 ENCOUNTER — Encounter: Payer: Self-pay | Admitting: Internal Medicine

## 2022-11-15 ENCOUNTER — Ambulatory Visit
Admission: RE | Admit: 2022-11-15 | Discharge: 2022-11-15 | Disposition: A | Discharge status: Home / Self Care | Payer: No Typology Code available for payment source | Source: Ambulatory Visit | Attending: Internal Medicine | Admitting: Internal Medicine

## 2022-11-15 DIAGNOSIS — Z1231 Encounter for screening mammogram for malignant neoplasm of breast: Secondary | ICD-10-CM

## 2022-11-21 ENCOUNTER — Encounter: Payer: Self-pay | Admitting: Physician Assistant

## 2022-11-27 ENCOUNTER — Other Ambulatory Visit: Payer: Self-pay

## 2022-11-27 DIAGNOSIS — D509 Iron deficiency anemia, unspecified: Secondary | ICD-10-CM

## 2022-11-28 LAB — CBC WITH DIFFERENTIAL/PLATELET
Basophils Absolute: 0 10*3/uL (ref 0.0–0.2)
Basos: 1 %
EOS (ABSOLUTE): 0.2 10*3/uL (ref 0.0–0.4)
Eos: 3 %
Hematocrit: 36.2 % (ref 34.0–46.6)
Hemoglobin: 11.4 g/dL (ref 11.1–15.9)
Immature Grans (Abs): 0 10*3/uL (ref 0.0–0.1)
Immature Granulocytes: 0 %
Lymphocytes Absolute: 2.1 10*3/uL (ref 0.7–3.1)
Lymphs: 35 %
MCH: 27.3 pg (ref 26.6–33.0)
MCHC: 31.5 g/dL (ref 31.5–35.7)
MCV: 87 fL (ref 79–97)
Monocytes Absolute: 0.5 10*3/uL (ref 0.1–0.9)
Monocytes: 9 %
Neutrophils Absolute: 3.1 10*3/uL (ref 1.4–7.0)
Neutrophils: 52 %
Platelets: 222 10*3/uL (ref 150–450)
RBC: 4.17 x10E6/uL (ref 3.77–5.28)
RDW: 16.4 % — ABNORMAL HIGH (ref 11.7–15.4)
WBC: 5.9 10*3/uL (ref 3.4–10.8)

## 2022-12-22 IMAGING — MG MM DIGITAL SCREENING BILAT W/ TOMO AND CAD
8 series · 9 of 24 positions shown · non-contrast
Comparison: Previous exam(s).

CLINICAL DATA: Screening.

EXAM:
DIGITAL SCREENING BILATERAL MAMMOGRAM WITH TOMOSYNTHESIS AND CAD
TECHNIQUE: Bilateral screening digital craniocaudal and mediolateral oblique
mammograms were obtained. Bilateral screening digital breast
tomosynthesis was performed. The images were evaluated with
computer-aided detection.

[L MLO synth-2D]
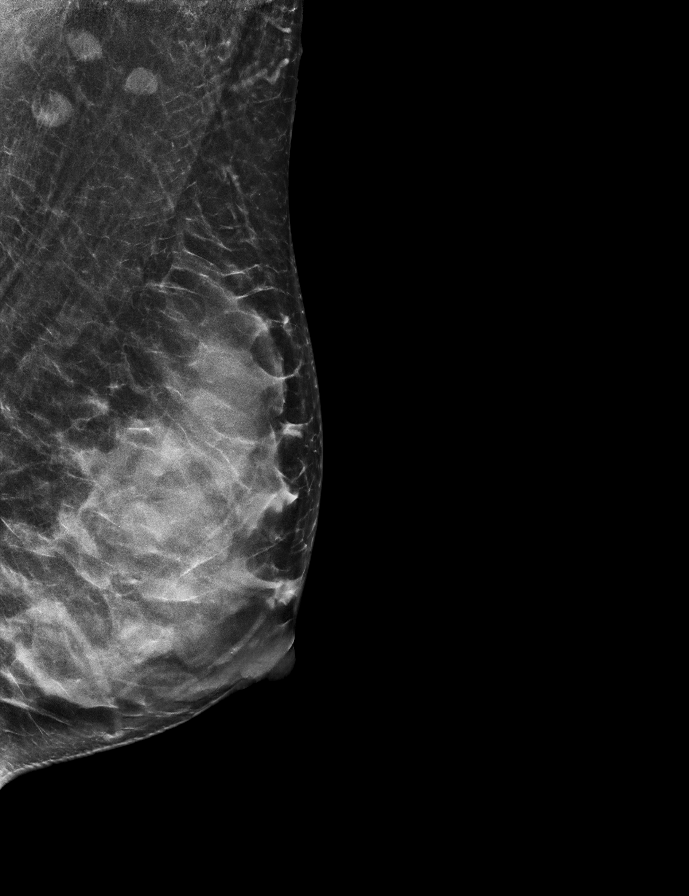

[R MLO synth-2D]
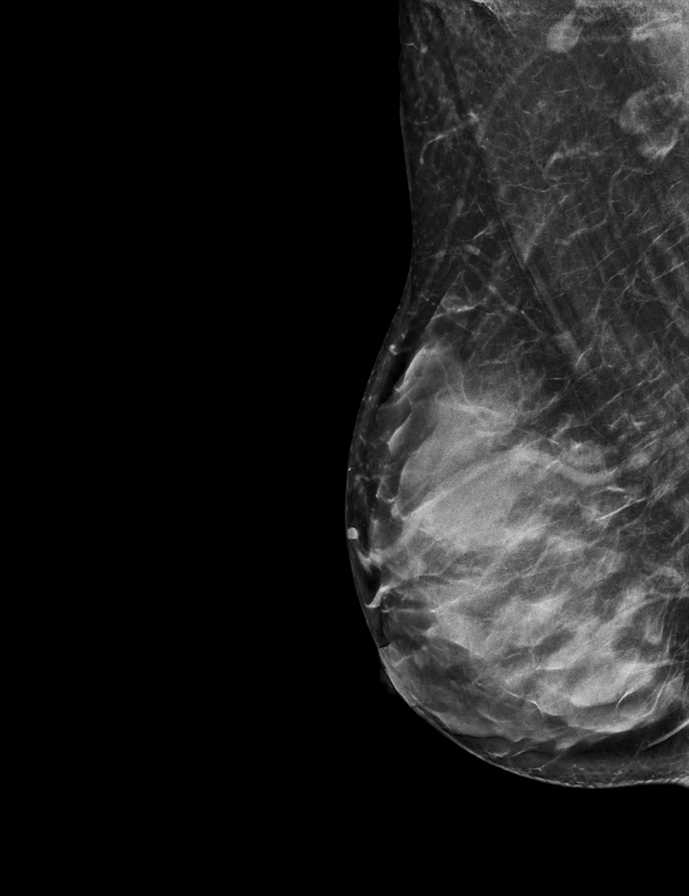

[R CC synth-2D]
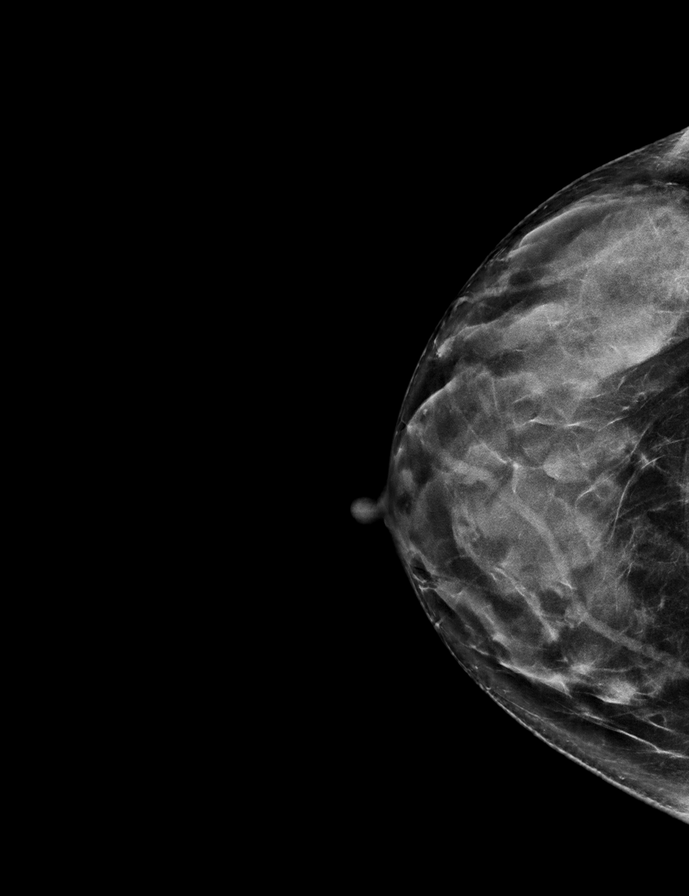

[L CC synth-2D]
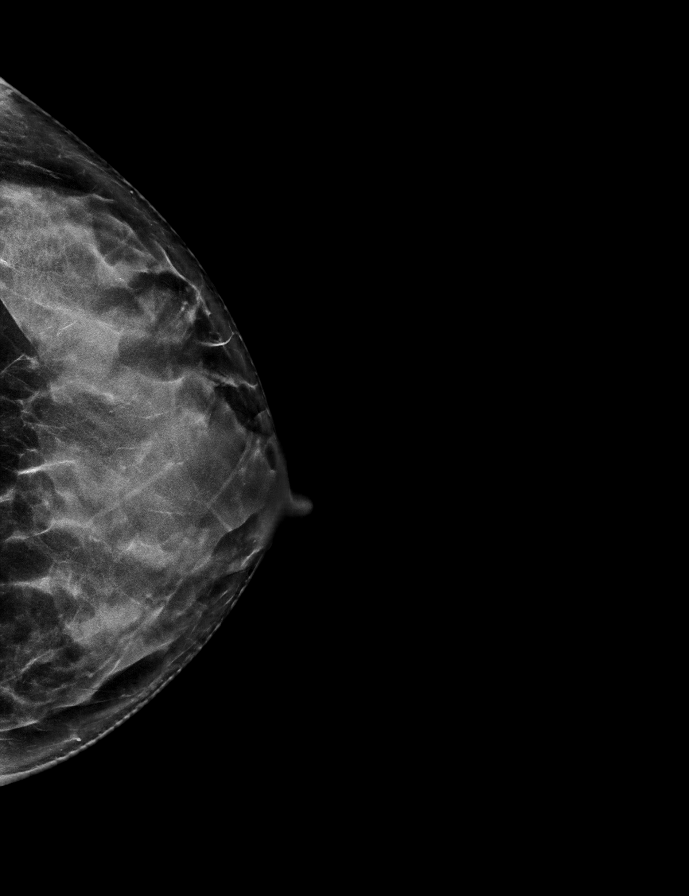

[R MLO tomo · 2 of 70 frames shown]
[frame 23/70]
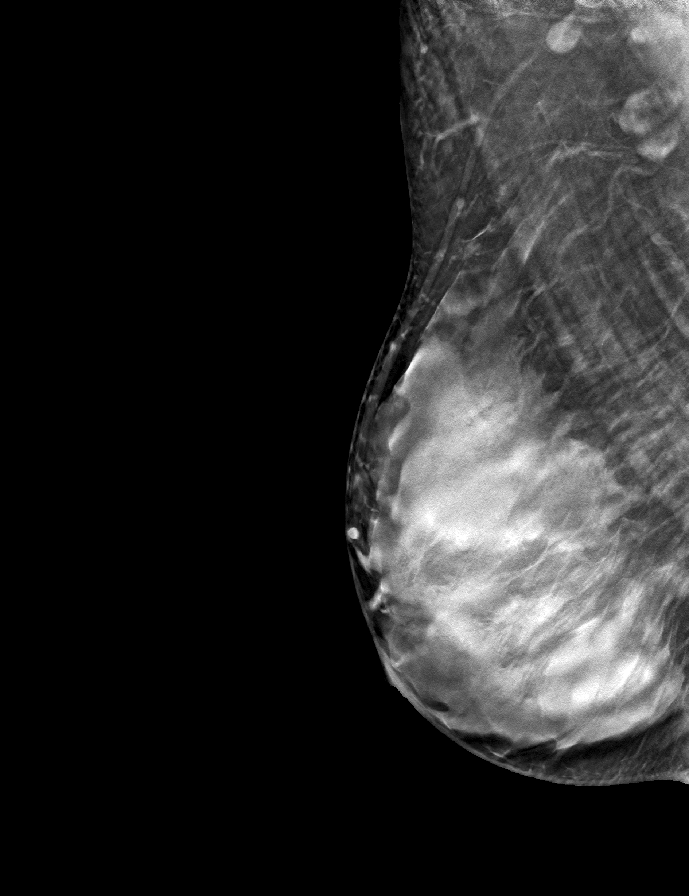
[frame 35/70]
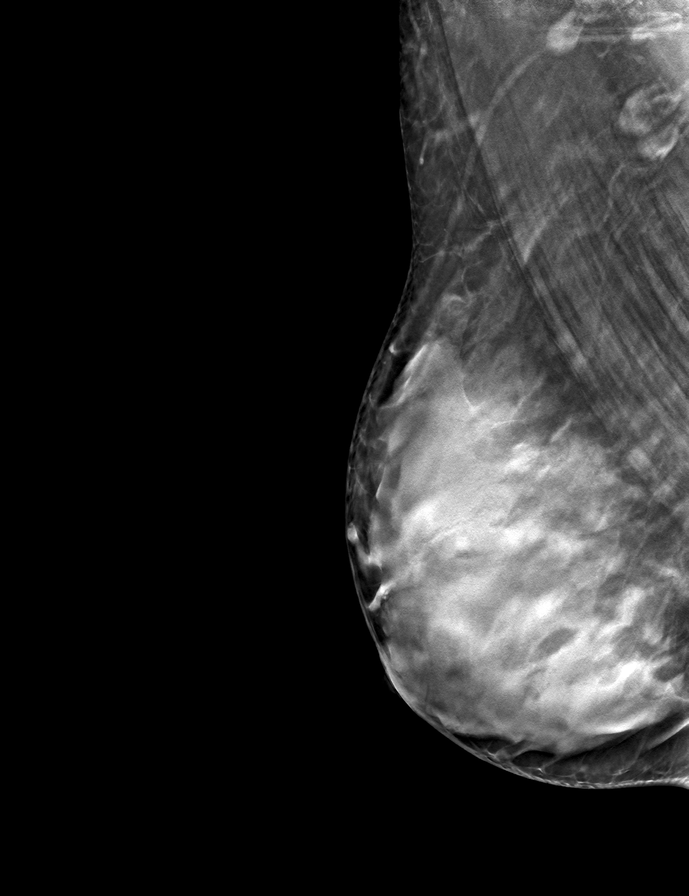

[R CC tomo · tomo slice 34/67.0]
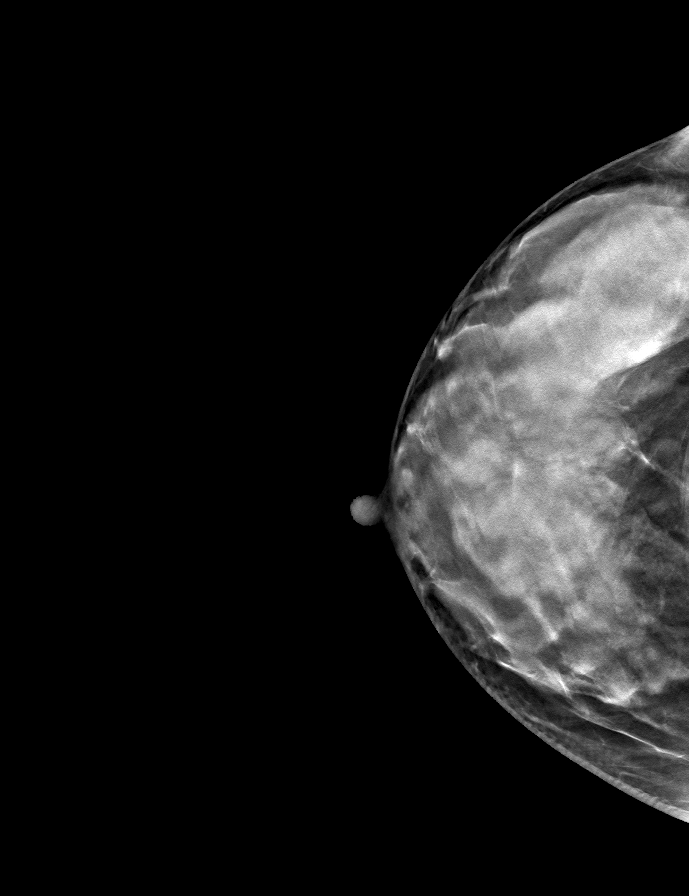

[L MLO tomo · tomo slice 35/69.0]
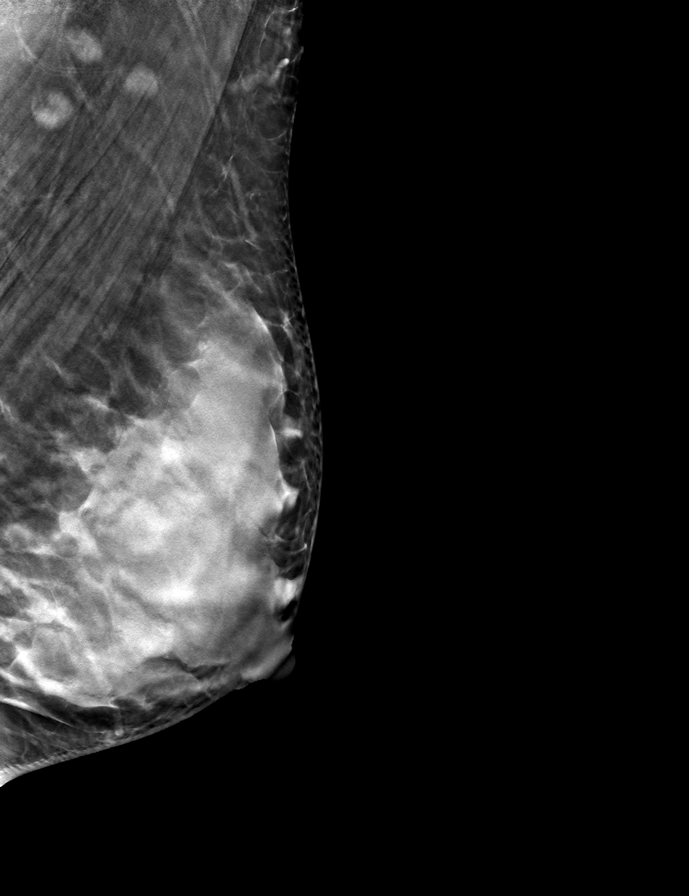

[L CC tomo · tomo slice 33/66.0]
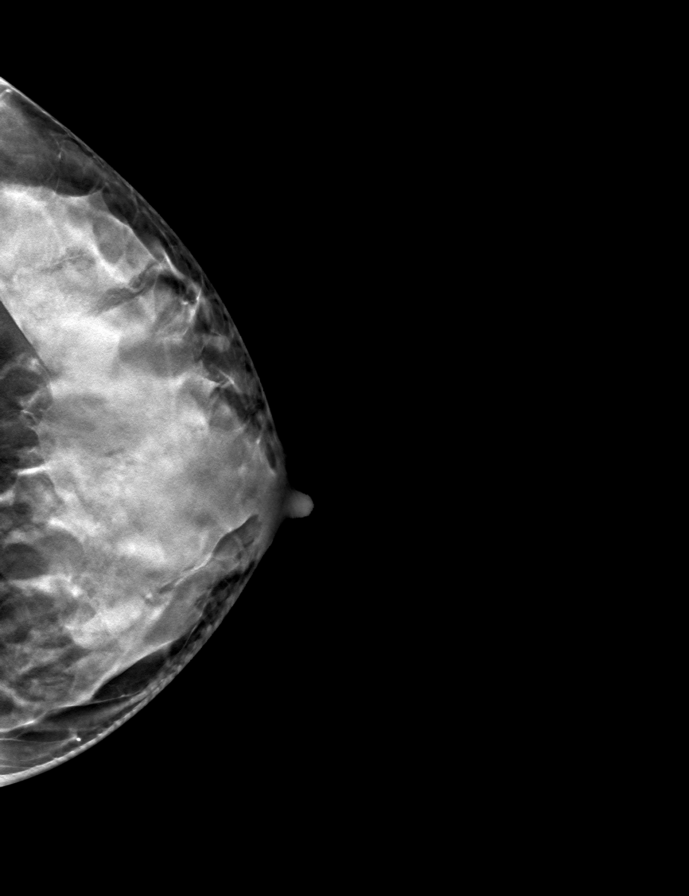

[9 of 24 positions shown; findings below may reference images not displayed]

ACR Breast Density Category d: The breast tissue is extremely dense,
which lowers the sensitivity of mammography
FINDINGS: There are no findings suspicious for malignancy.
IMPRESSION: No mammographic evidence of malignancy. A result letter of this
screening mammogram will be mailed directly to the patient.

RECOMMENDATION:
Screening mammogram in one year. (Code:TA-V-WV9)

BI-RADS CATEGORY  1: Negative.

## 2023-01-07 ENCOUNTER — Telehealth: Payer: Self-pay | Admitting: Internal Medicine

## 2023-01-07 DIAGNOSIS — K029 Dental caries, unspecified: Secondary | ICD-10-CM

## 2023-01-07 NOTE — Telephone Encounter (Signed)
Still needing dental referral

## 2023-02-01 ENCOUNTER — Ambulatory Visit (INDEPENDENT_AMBULATORY_CARE_PROVIDER_SITE_OTHER): Payer: Self-pay | Admitting: Physician Assistant

## 2023-02-01 ENCOUNTER — Encounter: Payer: Self-pay | Admitting: Physician Assistant

## 2023-02-01 VITALS — BP 136/76 | HR 72 | Ht 61.5 in | Wt 151.4 lb

## 2023-02-01 DIAGNOSIS — Z1211 Encounter for screening for malignant neoplasm of colon: Secondary | ICD-10-CM

## 2023-02-01 NOTE — Progress Notes (Signed)
Subjective:    Patient ID: Nicole Dodson, female    DOB: 05-27-1976, 47 y.o.   MRN: [id number]  HPI Nicole Dodson is a 47 year old, non-English-speaking Hispanic female, new to GI today referred by Dr[facility] Lan[facility] for consideration of colonoscopy in the setting of mild iron deficiency anemia, and also for screening. Patient is generally in good health has history of hypertension/anxiety and depression. She did fit test in April 2024 which was negative. Reviewing her labs May 2024 hemoglobin 11.4/hematocrit 36.2/MCV of 87 March 2024 hemoglobin 10.5/hematocrit 33.06 Nov 2020 hemoglobin 11/hematocrit 33 Iron studies were done March 2024 with serum iron 47/TIBC 365/iron sat 11 B12 695 and folate greater than 20.  Per Dr. Renne Crigler notes patient has had heavy periods that last for about 4 days, no changes in this pattern long-term. Patient has not been aware of ever seeing any blood per rectum, no melena or hematochezia.  She denies any problems with her bowels usually has 1-2 bowel movements per day.  No complaints of abdominal pain.  Appetite has been good weight has been stable. No complaints of heartburn or indigestion or dysphagia. She is not on any regular aspirin or NSAIDs. No family history of colon cancer that she is aware of.  She has not had any prior GI evaluation  Review of Systems. Pertinent positive and negative review of systems were noted in the above HPI section.  All other review of systems was otherwise negative.   Outpatient Encounter Medications as of 02/01/2023  Medication Sig   escitalopram (LEXAPRO) 10 MG tablet Take 1 tablet (10 mg total) by mouth daily.   ferrous gluconate (FERGON) 324 MG tablet Take 1 tablet (324 mg total) by mouth daily.   losartan-hydrochlorothiazide (HYZAAR) 50-12.5 MG tablet Take 1 tablet by mouth daily.   Multiple Vitamins-Minerals (MULTIVITAMIN WITH MINERALS) tablet Take 1 tablet by mouth daily.   [DISCONTINUED]  Glucosamine HCl (GLUCOSAMINE PO) Take 1 capsule by mouth daily. (Patient not taking: Reported on 11/06/2022)   [DISCONTINUED] hydrochlorothiazide (MICROZIDE) 12.5 MG capsule Take 1 capsule (12.5 mg total) by mouth daily.   No facility-administered encounter medications on file as of 02/01/2023.   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   Heart murmur 08/30/2022   Bilateral fibrocystic breast disease 09/19/2021   Dental decay 09/19/2021   Primary hypertension 09/23/2020   Breast pain 09/23/2020   Social History   Socioeconomic History   Marital status: Married    Spouse name: Dellia Nims   Number of children: 3   Years of education: Not on file   Highest education level: 6th grade  Occupational History   Occupation: Housecleaning  Tobacco Use   Smoking status: Never    Passive exposure: Never   Smokeless tobacco: Never  Vaping Use   Vaping status: Never Used  Substance and Sexual Activity   Alcohol use: No   Drug use: Never   Sexual activity: Yes    Birth control/protection: None  Other Topics Concern   Not on file  Social History Narrative   Lives at home with her husband, their 3 children and her sister in Social worker.   Originally from [place]   Came to [employer]. in 2000  Social Determinants of Health   Financial Resource Strain: Low Risk  (08/30/2022)   Overall Financial Resource Strain (CARDIA)    Difficulty of Paying Living Expenses: Not hard at all  Food Insecurity: No Food Insecurity (08/30/2022)   Hunger Vital Sign    Worried About Running Out of Food in the Last Year: Never true    Ran Out of Food in the Last Year: Never true  Transportation Needs: No  Transportation Needs (08/30/2022)   PRAPARE - Administrator, Civil Service (Medical): No    Lack of Transportation (Non-Medical): No  Physical Activity: Not on file  Stress: Not on file  Social Connections: Not on file  Intimate Partner Violence: Not At Risk (08/30/2022)   Humiliation, Afraid, Rape, and Kick questionnaire    Fear of Current or Ex-Partner: No    Emotionally Abused: No    Physically Abused: No    Sexually Abused: No    Nicole Dodson's family history includes Anxiety disorder in her brother; Depression in her paternal grandmother; Hyperlipidemia in her daughter; Hypertension in her father and mother; Other in her daughter.      Objective:    Vitals:   02/01/23 1353 02/01/23 1512  BP: (!) 140/78 136/76  Pulse: 72     Physical Exam Well-developed well-nourished Hispanic female in no acute distress.  Height, Weight, 151 BMI 28.14 accompanied by interpreter  HEENT; nontraumatic normocephalic, EOMI, PE R LA, sclera anicteric.  Skin; benign exam, no jaundice rash or appreciable lesions Extremities; no clubbing cyanosis or edema skin warm and dry Neuro/Psych; alert and oriented x4, grossly nonfocal mood and affect appropriate        Assessment & Plan:   #37 47 year old non-English-speaking Hispanic female referred for colonoscopy for screening Patient is currently asymptomatic, negative fit test April 2024 Average risk and no family history.  #2 mild chronic anemia, mild iron deficiency with most recent studies March 2024 with serum iron 47/TIBC 365 and iron sat of 11. There is history of heavier menses.  #3 hypertension #4 history of anxiety/depression  Plan; patient will be scheduled for colonoscopy with Dr. Tomasa Rand.  Procedure was discussed in detail with the patient including indications risk and benefits and she is agreeable to proceed.  She did have questions about necessity of anesthesia and rationale for this was explained. (She  does have an orange card which apparently is expiring at the end of this month.  She is hoping to have procedure done in that interval)  Nicole Dodson S Britain Saber PA-C 02/01/2023   Cc: Julieanne Manson, MD

## 2023-02-01 NOTE — Patient Instructions (Addendum)
_______________________________________________________  If your blood pressure at your visit was 140/90 or greater, please contact your primary care physician to follow up on this.  If you are age 46 or younger, your body mass index should be between 19-25. Your Body mass index is 28.14 kg/m. If this is out of the aformentioned range listed, please consider follow up with your Primary Care Provider.  ________________________________________________________  The [facility] providers would like to encourage you to use [facility] to communicate with providers for non-urgent requests or questions.  Due to long hold times on the telephone, sending your provider a message by [facility] may be a faster and more efficient way to get a response.  Please allow 48 business hours for a response.  Please remember that this is for non-urgent requests.  _______________________________________________________   Nicole Dodson have been scheduled for a colonoscopy. Please follow written instructions given to you at your visit today.   Please pick up your prep supplies at the pharmacy within the next 1-3 days.  If you use inhalers (even only as needed), please bring them with you on the day of your procedure.  DO NOT TAKE 7 DAYS PRIOR TO TEST- Trulicity (dulaglutide) Ozempic, Wegovy (semaglutide) Mounjaro (tirzepatide) Bydureon Bcise (exanatide extended release)  DO NOT TAKE 1 DAY PRIOR TO YOUR TEST Rybelsus (semaglutide)Adlyxin (lixisenatide) Victoza (liraglutide) Byetta (exanatide)   Due to recent changes in healthcare laws, you may see the results of your imaging and laboratory studies on MyChart before your provider has had a chance to review them.  We understand that in some cases there may be results that are confusing or concerning to you. Not all laboratory results come back in the same time frame and the provider may be waiting for multiple results in order to interpret others.  Please give [place] 48 hours in order  for your provider to thoroughly review all the results before contacting the office for clarification of your results.    Thank you for entrusting me with your care and choosing [facility].  Amy Esterwood, PA-C

## 2023-02-06 ENCOUNTER — Ambulatory Visit (AMBULATORY_SURGERY_CENTER): Payer: Self-pay | Admitting: Gastroenterology

## 2023-02-06 ENCOUNTER — Encounter: Payer: Self-pay | Admitting: Gastroenterology

## 2023-02-06 VITALS — BP 112/51 | HR 58 | Temp 98.4°F | Resp 16 | Ht 61.0 in | Wt 151.0 lb

## 2023-02-06 DIAGNOSIS — Z1211 Encounter for screening for malignant neoplasm of colon: Secondary | ICD-10-CM

## 2023-02-06 MED ORDER — SODIUM CHLORIDE 0.9 % IV SOLN: 500.0000 mL | Freq: Once | INTRAVENOUS | Status: DC

## 2023-02-06 NOTE — Progress Notes (Signed)
History and Physical Interval Note:  02/06/2023 10:13 AM  Nicole Dodson  has presented today for endoscopic procedure(s), with the diagnosis of  Encounter Diagnosis  Name Primary?   Colon cancer screening Yes  .  The various methods of evaluation and treatment have been discussed with the patient and/or family. After consideration of risks, benefits and other options for treatment, the patient has consented to  the endoscopic procedure(s).   The patient's history has been reviewed, patient examined, no change in status, stable for endoscopic procedure(s).  I have reviewed the patient's chart and labs.  Questions were answered to the patient's satisfaction.     Weslie Rasmus E. Tomasa Rand, MD [facility]

## 2023-02-06 NOTE — Patient Instructions (Addendum)
REPEAT Colonoscopy in 10 years for screening purposes.  YOU HAD AN ENDOSCOPIC PROCEDURE TODAY AT THE [facility]:   Refer to the procedure report that was given to you for any specific questions about what was found during the examination.  If the procedure report does not answer your questions, please call your gastroenterologist to clarify.  If you requested that your care partner not be given the details of your procedure findings, then the procedure report has been included in a sealed envelope for you to review at your convenience later.  YOU SHOULD EXPECT: Some feelings of bloating in the abdomen. Passage of more gas than usual.  Walking can help get rid of the air that was put into your GI tract during the procedure and reduce the bloating. If you had a lower endoscopy (such as a colonoscopy or flexible sigmoidoscopy) you may notice spotting of blood in your stool or on the toilet paper. If you underwent a bowel prep for your procedure, you may not have a normal bowel movement for a few days.  Please Note:  You might notice some irritation and congestion in your nose or some drainage.  This is from the oxygen used during your procedure.  There is no need for concern and it should clear up in a day or so.  SYMPTOMS TO REPORT IMMEDIATELY:  Following lower endoscopy (colonoscopy or flexible sigmoidoscopy):  Excessive amounts of blood in the stool  Significant tenderness or worsening of abdominal pains  Swelling of the abdomen that is new, acute  Fever of 100F or higher  For urgent or emergent issues, a gastroenterologist can be reached at any hour by calling [phone]. Do not use MyChart messaging for urgent concerns.    DIET:  We do recommend a small meal at first, but then you may proceed to your regular diet.  Drink plenty of fluids but you should avoid alcoholic beverages for 24 hours.  ACTIVITY:  You should plan to take it easy for the rest of today and you should  NOT DRIVE or use heavy machinery until tomorrow (because of the sedation medicines used during the test).    FOLLOW UP: Our staff will call the number listed on your records the next business day following your procedure.  We will call around 7:15- 8:00 am to check on you and address any questions or concerns that you may have regarding the information given to you following your procedure. If we do not reach you, we will leave a message.     If any biopsies were taken you will be contacted by phone or by letter within the next 1-3 weeks.  Please call us at [phone] if you have not heard about the biopsies in 3 weeks.    SIGNATURES/CONFIDENTIALITY: You and/or your care partner have signed paperwork which will be entered into your electronic medical record.  These signatures attest to the fact that that the information above on your After Visit Summary has been reviewed and is understood.  Full responsibility of the confidentiality of this discharge information lies with you and/or your care-partner.   USTED TUVO UN PROCEDIMIENTO ENDOSCPICO HOY EN EL [facility]:   Lea el informe del procedimiento que se le entreg para cualquier pregunta especfica sobre lo que se Dentist.  Si el informe del examen no responde a sus preguntas, por favor llame a su gastroenterlogo para aclararlo.  Si usted solicit que no se le den [employer] de  lo que se encontr en su procedimiento al acompaante que le va a cuidar, entonces el informe del procedimiento se ha incluido en un sobre sellado para que usted lo revise despus cuando le sea ms conveniente.   LO QUE PUEDE ESPERAR: Algunas sensaciones de hinchazn en el abdomen.  Puede tener ms gases de lo normal.  El caminar puede ayudarle a eliminar el aire que se le puso en el tracto gastrointestinal durante el procedimiento y reducir la hinchazn.  Si le hicieron una endoscopia inferior (como una colonoscopia o una sigmoidoscopia  flexible), podra notar manchas de sangre en las heces fecales o en el papel higinico.  Si se someti a una preparacin intestinal para su procedimiento, es posible que no tenga una evacuacin intestinal normal durante [employer].   Tenga en cuenta:  Es posible que note un poco de irritacin y congestin en la nariz o algn drenaje.  Esto es debido al oxgeno [employer] durante su procedimiento.  No hay que preocuparse y esto debe desaparecer ms o Regulatory affairs officer.   SNTOMAS PARA REPORTAR INMEDIATAMENTE:  Despus de una endoscopia inferior (colonoscopia o sigmoidoscopia flexible):  Cantidades excesivas de sangre en las heces fecales  Sensibilidad significativa o empeoramiento de los dolores abdominales   Hinchazn aguda del abdomen que antes no tena   Fiebre de 100F o ms    Para asuntos urgentes o de Associate Professor, puede comunicarse con un gastroenterlogo a cualquier hora llamando al [phone].  DIETA:  Recomendamos una comida pequea al principio, pero luego puede continuar con su dieta normal.  Tome muchos lquidos, Tax adviser las bebidas alcohlicas durante 24 horas.    ACTIVIDAD:  Debe planear tomarse las cosas con calma por el resto del da y no debe CONDUCIR ni usar maquinaria pesada Patent examiner (debido a los medicamentos de sedacin utilizados durante el examen).     SEGUIMIENTO: Nuestro personal llamar al nmero que aparece en su historial al siguiente da hbil de su procedimiento para ver cmo se siente y para responder cualquier pregunta o inquietud que pueda tener con respecto a la informacin que se le dio despus del procedimiento. Si no podemos contactarle, le dejaremos un mensaje.  Sin embargo, si se siente bien y no tiene English as a second language teacher, no es necesario que nos devuelva la llamada.  Asumiremos que ha regresado a sus actividades diarias normales sin incidentes. Si se le tomaron algunas biopsias, le contactaremos por telfono o por carta en las prximas 3 semanas.   Si no ha sabido [employer] biopsias en el transcurso de 3 semanas, por favor llmenos al [phone].   FIRMAS/CONFIDENCIALIDAD: Usted y/o el acompaante que le cuide han firmado documentos que se ingresarn en su historial mdico electrnico.  Estas firmas atestiguan el hecho de que la informacin anterior

## 2023-02-06 NOTE — Progress Notes (Signed)
Vss nad trans to pacu 

## 2023-02-06 NOTE — Op Note (Signed)
[facility] Patient Name: Nicole Dodson Procedure Date: 02/06/2023 10:11 AM MRN: [id number] Endoscopist: Lorin Picket E. Tomasa Rand , MD, [id number] Age: 47 Referring MD:  Date of Birth: 1976/02/14 Gender: Female Account #: [id number] Procedure:                Colonoscopy Indications:              Screening for colorectal malignant neoplasm, This                            is the patient's first colonoscopy Medicines:                Monitored Anesthesia Care Procedure:                Pre-Anesthesia Assessment:                           - Prior to the procedure, a History and Physical                            was performed, and patient medications and                            allergies were reviewed. The patient's tolerance of                            previous anesthesia was also reviewed. The risks                            and benefits of the procedure and the sedation                            options and risks were discussed with the patient.                            All questions were answered, and informed consent                            was obtained. Prior Anticoagulants: The patient has                            taken no anticoagulant or antiplatelet agents. ASA                            Grade Assessment: II - A patient with mild systemic                            disease. After reviewing the risks and benefits,                            the patient was deemed in satisfactory condition to                            undergo the procedure.  After obtaining informed consent, the colonoscope                            was passed under direct vision. Throughout the                            procedure, the patient's blood pressure, pulse, and                            oxygen saturations were monitored continuously. The                            Olympus CF-HQ190L ([id number]) Colonoscope was                            introduced  through the anus and advanced to the the                            terminal ileum, with identification of the                            appendiceal orifice and IC valve. The colonoscopy                            was performed without difficulty. The patient                            tolerated the procedure well. The quality of the                            bowel preparation was good. The terminal ileum,                            ileocecal valve, appendiceal orifice, and rectum                            were photographed. The bowel preparation used was                            SUPREP via split dose instruction. Scope In: 10:20:59 AM Scope Out: 10:35:19 AM Scope Withdrawal Time: 0 hours 8 minutes 39 seconds  Total Procedure Duration: 0 hours 14 minutes 20 seconds  Findings:                 The perianal and digital rectal examinations were                            normal. Pertinent negatives include normal                            sphincter tone and no palpable rectal lesions.                           The colon (entire examined portion) appeared normal.  The terminal ileum appeared normal.                           The retroflexed view of the distal rectum and anal                            verge was normal and showed no anal or rectal                            abnormalities. Complications:            No immediate complications. Estimated Blood Loss:     Estimated blood loss: none. Impression:               - The entire examined colon is normal.                           - The examined portion of the ileum was normal.                           - The distal rectum and anal verge are normal on                            retroflexion view.                           - No specimens collected. Recommendation:           - Patient has a contact number available for                            emergencies. The signs and symptoms of potential                             delayed complications were discussed with the                            patient. Return to normal activities tomorrow.                            Written discharge instructions were provided to the                            patient.                           - Resume previous diet.                           - Continue present medications.                           - Repeat colonoscopy in 10 years for screening                            purposes. Cordarrius Coad E. Tomasa Rand, MD 02/06/2023 10:39:38 AM This report has been signed electronically.

## 2023-02-06 NOTE — Progress Notes (Signed)
Pt's states no medical or surgical changes since previsit or office visit. VS assessed by C.W 

## 2023-02-06 NOTE — Progress Notes (Signed)
Agree with the assessment and plan as outlined by Amy Esterwood, PA-C.  Albaraa Swingle E. Kalisi Bevill, MD  

## 2023-02-07 ENCOUNTER — Telehealth: Payer: Self-pay | Admitting: *Deleted

## 2023-02-07 NOTE — Telephone Encounter (Signed)
Attempted to call patient for their post-procedure follow-up call. No answer. Unable to leave a voicemail. Please call [facility] if you have any questions or concerns following your procedure.

## 2023-06-18 ENCOUNTER — Other Ambulatory Visit: Payer: Self-pay

## 2023-06-18 DIAGNOSIS — R059 Cough, unspecified: Secondary | ICD-10-CM

## 2023-06-18 LAB — POCT INFLUENZA A/B
Influenza A, POC: NEGATIVE
Influenza B, POC: NEGATIVE

## 2023-06-18 LAB — POC COVID19 BINAXNOW: SARS Coronavirus 2 Ag: NEGATIVE

## 2023-06-18 NOTE — Progress Notes (Signed)
Patient has been experiencing cough and congestion for the past couple of days. Patient wanted to get Covid and influenza test done to confirm that it is not the cause of her symptoms.

## 2023-09-02 ENCOUNTER — Encounter: Payer: Self-pay | Admitting: Internal Medicine

## 2023-09-06 ENCOUNTER — Other Ambulatory Visit: Payer: Self-pay | Admitting: Obstetrics and Gynecology

## 2023-09-06 DIAGNOSIS — Z1231 Encounter for screening mammogram for malignant neoplasm of breast: Secondary | ICD-10-CM

## 2023-10-18 ENCOUNTER — Other Ambulatory Visit: Payer: Self-pay | Admitting: Internal Medicine

## 2023-10-21 ENCOUNTER — Other Ambulatory Visit: Payer: Self-pay

## 2023-10-21 MED ORDER — ESCITALOPRAM OXALATE 10 MG PO TABS: 10.0000 mg | ORAL_TABLET | Freq: Every day | ORAL | 3 refills | Status: DC

## 2023-11-21 ENCOUNTER — Ambulatory Visit
Admission: RE | Admit: 2023-11-21 | Discharge: 2023-11-21 | Disposition: A | Discharge status: Home / Self Care | Payer: Self-pay | Source: Ambulatory Visit | Attending: Obstetrics and Gynecology | Admitting: Obstetrics and Gynecology

## 2023-11-21 ENCOUNTER — Ambulatory Visit: Payer: Self-pay | Admitting: *Deleted

## 2023-11-21 VITALS — BP 109/63 | Wt 156.0 lb

## 2023-11-21 DIAGNOSIS — Z1231 Encounter for screening mammogram for malignant neoplasm of breast: Secondary | ICD-10-CM

## 2023-11-21 DIAGNOSIS — Z1239 Encounter for other screening for malignant neoplasm of breast: Secondary | ICD-10-CM

## 2023-11-21 NOTE — Progress Notes (Signed)
 [name] is a 48 y.o. female who presents to [facility] clinic today with no complaints.    Pap Smear: Pap smear not completed today. Last Pap smear was 02/20/2022 at the cervical cancer screening clinic and was normal with negative HPV. Per patient has no history of an abnormal Pap smear. Last Pap smear result is available in Epic.   Physical exam: Breasts Breasts symmetrical. No skin abnormalities bilateral breasts. No nipple retraction bilateral breasts. No nipple discharge bilateral breasts. No lymphadenopathy. No lumps palpated bilateral breasts. No complaints of pain or tenderness on exam.  MS 3D SCR MAMMO BILAT BR (aka MM) Result Date: 11/19/2022 CLINICAL DATA:  Screening. EXAM: DIGITAL SCREENING BILATERAL MAMMOGRAM WITH TOMOSYNTHESIS AND CAD TECHNIQUE: Bilateral screening digital craniocaudal and mediolateral oblique mammograms were obtained. Bilateral screening digital breast tomosynthesis was performed. The images were evaluated with computer-aided detection. COMPARISON:  Previous exam(s). ACR Breast Density Category c: The breasts are heterogeneously dense, which may obscure small masses. FINDINGS: There are no findings suspicious for malignancy. IMPRESSION: No mammographic evidence of malignancy. A result letter of this screening mammogram will be mailed directly to the patient. RECOMMENDATION: Screening mammogram in one year. (Code:SM-B-01Y) BI-RADS CATEGORY  1: Negative. Electronically Signed   By: Dina  Arceo M.D.   On: 11/19/2022 09:22   MS DIGITAL SCREENING TOMO BILATERAL Result Date: 09/15/2021 CLINICAL DATA:  Screening. EXAM: DIGITAL SCREENING BILATERAL MAMMOGRAM WITH TOMOSYNTHESIS AND CAD TECHNIQUE: Bilateral screening digital craniocaudal and mediolateral oblique mammograms were obtained. Bilateral screening digital breast tomosynthesis was performed. The images were evaluated with computer-aided detection. COMPARISON:  Previous exam(s). ACR Breast Density Category d: The  breast tissue is extremely dense, which lowers the sensitivity of mammography FINDINGS: There are no findings suspicious for malignancy. IMPRESSION: No mammographic evidence of malignancy. A result letter of this screening mammogram will be mailed directly to the patient. RECOMMENDATION: Screening mammogram in one year. (Code:SM-B-01Y) BI-RADS CATEGORY  1: Negative. Electronically Signed   By: Jone Neither M.D.   On: 09/15/2021 12:50   MS DIGITAL SCREENING TOMO BILATERAL Result Date: 08/29/2020 CLINICAL DATA:  Screening. EXAM: DIGITAL SCREENING BILATERAL MAMMOGRAM WITH TOMOSYNTHESIS AND CAD TECHNIQUE: Bilateral screening digital craniocaudal and mediolateral oblique mammograms were obtained. Bilateral screening digital breast tomosynthesis was performed. The images were evaluated with computer-aided detection. COMPARISON:  None. ACR Breast Density Category c: The breast tissue is heterogeneously dense, which may obscure small masses FINDINGS: There are no findings suspicious for malignancy. IMPRESSION: No mammographic evidence of malignancy. A result letter of this screening mammogram will be mailed directly to the patient. RECOMMENDATION: Screening mammogram in one year. (Code:SM-B-01Y) BI-RADS CATEGORY  1: Negative. Electronically Signed   By: Dina  Arceo M.D.   On: 08/29/2020 08:57        Pelvic/Bimanual Pap is not indicated today per BCCCP guidelines.   Smoking History: Patient has never smoked.   Patient Navigation: Patient education provided. Access to services provided for patient through [facility] program. Spanish interpreter Nicole Dodson from [facility] provided.   Colorectal Cancer Screening: Per patient has had a colonoscopy completed 8 months ago and a FIT test 1 year ago with both negative. No complaints today.    Breast and Cervical Cancer Risk Assessment: Patient does not have family history of breast cancer, known genetic mutations, or radiation treatment to the chest before age 53. Patient  does not have history of cervical dysplasia, immunocompromised, or DES exposure in-utero.  Risk Scores as of Encounter on 11/21/2023     Nicole Dodson  5-year 0.42%   Lifetime 5.4%            Last calculated by Nicole Dodson, Nicole Dodson, CMA on 11/21/2023 at  9:46 AM        A: BCCCP exam without pap smear No complaints.  P: Referred patient to the [facility] for a screening mammogram on mobile unit. Appointment scheduled Thursday, Nov 21, 2023 at 1030.  Nicole Edge, RN 11/21/2023 10:04 AM

## 2023-11-21 NOTE — Patient Instructions (Signed)
 Explained breast self awareness with Nicole Dodson. Patient did not need a Pap smear today due to last Pap smear and HPV typing was 01/31/2022. Let her know BCCCP will cover Pap smears and HPV typing every 5 years unless has a history of abnormal Pap smears. Referred patient to the [facility] for a screening mammogram on mobile unit. Appointment scheduled Thursday, Nov 21, 2023 at 1030. Patient aware of appointment and will be there. Let patient know the Breast Center will follow up with her within the next couple weeks with results of mammogram by letter or phone. Nicole Dodson verbalized understanding.  Nicole Dodson, Nicole Favor, RN 10:04 AM

## 2024-02-11 ENCOUNTER — Encounter: Payer: Self-pay | Admitting: Internal Medicine

## 2024-02-11 ENCOUNTER — Ambulatory Visit: Payer: Self-pay | Admitting: Internal Medicine

## 2024-02-11 VITALS — BP 117/78 | HR 64 | Resp 17 | Ht 61.0 in | Wt 155.0 lb

## 2024-02-11 DIAGNOSIS — I10 Essential (primary) hypertension: Secondary | ICD-10-CM

## 2024-02-11 DIAGNOSIS — L720 Epidermal cyst: Secondary | ICD-10-CM | POA: Insufficient documentation

## 2024-02-11 DIAGNOSIS — Z0000 Encounter for general adult medical examination without abnormal findings: Secondary | ICD-10-CM

## 2024-02-11 DIAGNOSIS — K029 Dental caries, unspecified: Secondary | ICD-10-CM

## 2024-02-11 MED ORDER — ESCITALOPRAM OXALATE 10 MG PO TABS: 10.0000 mg | ORAL_TABLET | Freq: Every day | ORAL | 3 refills | Status: AC

## 2024-02-11 NOTE — Progress Notes (Signed)
 Subjective:    Patient ID: Nicole Dodson, female   DOB: 07-01-1976, 48 y.o.   MRN: [id number]   HPI  CPE without pap  1.  Pap:  Last 02/20/2022 with benign reactive changes.    2.  Mammogram:  Last 10/2023 and normal.  No family history of breast cancer.    3.  Osteoprevention:  Yogurt here and there.  Does not drink almond milk often now.  As last year, willing to increase to 3-4 servings daily.  Runs 3 times weekly for 30 minutes.    4.  Guaiac Cards/FIT:  Last 10/2022 and negative  5.  Colonoscopy:  Last 01/2023 and normal with Dr.  Stacia, GI   Performed for anemia.    6.  Immunizations:  Needs second of Hepatitis B if to complete coverage started in 2024, but did get series as child as well.   Immunization History  Administered Date(s) Administered   Hepb-cpg 10/24/2022   IPV 10/31/2022   Influenza, Seasonal, Injecte, Preservative Fre 04/07/2009   MMR 10/24/2022   Moderna Covid-19 Fall Seasonal Vaccine 37yrs & older 10/24/2022   PFIZER(Purple Top)SARS-COV-2 Vaccination 10/24/2019, 11/14/2019   Td 07/02/1993, 10/21/2003   Tdap 07/26/2009, 09/19/2021   Varicella 10/24/2022     7.  Glucose/Cholesterol:  Blood glucose and cholesterol fine in past. Lipid Panel     Component Value Date/Time   CHOL 169 09/13/2022 1155   TRIG 47 09/13/2022 1155   HDL 72 09/13/2022 1155   CHOLHDL 2.5 09/07/2020 1030   LDLCALC 87 09/13/2022 1155   LABVLDL 10 09/13/2022 1155     Current Meds  Medication Sig   escitalopram  (LEXAPRO ) 10 MG tablet Take 1 tablet (10 mg total) by mouth daily.   ferrous gluconate  (FERGON) 324 MG tablet Take 1 tablet (324 mg total) by mouth daily.   losartan -hydrochlorothiazide  (HYZAAR) 50-12.5 MG tablet Take 1 tablet by mouth once daily   Multiple Vitamins-Minerals (MULTIVITAMIN WITH MINERALS) tablet Take 1 tablet by mouth daily.   No Known Allergies     Review of Systems  HENT:  Positive for dental problem (Cavities in 3 molars and some  discomfort with another tooth.).   Eyes:  Negative for visual disturbance.  Respiratory:  Negative for shortness of breath.   Cardiovascular:  Negative for chest pain, palpitations and leg swelling.  Gastrointestinal:  Negative for abdominal pain and blood in stool (No melena.).       Bump in anal area which itches.  Wondering if a hemorrhoid.   Has not been a problem recently.   Strains with hard stools once weekly.  Neurological:  Negative for weakness and numbness.  Psychiatric/Behavioral:  Negative for dysphoric mood. The patient is nervous/anxious (one episode a month  Able to self calm).       Objective:   BP 117/78 (BP Location: Left Arm, Patient Position: Sitting, Cuff Size: Normal)   Pulse 64   Resp 17   Ht 5' 1 (1.549 m)   Wt 155 lb (70.3 kg)   LMP 02/06/2024   BMI 29.29 kg/m   Physical Exam Constitutional:      Appearance: Normal appearance.  HENT:     Head: Normocephalic and atraumatic.     Right Ear: Tympanic membrane, ear canal and external ear normal.     Left Ear: Tympanic membrane, ear canal and external ear normal.     Nose: Nose normal.     Mouth/Throat:     Mouth: Mucous membranes are moist.  Pharynx: Oropharynx is clear.  Eyes:     Extraocular Movements: Extraocular movements intact.     Conjunctiva/sclera: Conjunctivae normal.     Pupils: Pupils are equal, round, and reactive to light.     Comments: Discs sharp  Neck:     Thyroid: No thyroid mass or thyromegaly.  Cardiovascular:     Rate and Rhythm: Normal rate and regular rhythm.     Heart sounds: S1 normal and S2 normal. No murmur heard.    No friction rub. No S3 or S4 sounds.     Comments: No carotid bruits.  Carotid, radial, femoral, DP and PT pulses normal and equal.   Pulmonary:     Effort: Pulmonary effort is normal.     Breath sounds: Normal breath sounds and air entry.  Chest:  Breasts:    Right: No inverted nipple, mass or nipple discharge.     Left: No inverted nipple, mass  or nipple discharge.  Abdominal:     General: Abdomen is flat. Bowel sounds are normal.     Palpations: Abdomen is soft. There is no hepatomegaly, splenomegaly or mass.     Tenderness: There is no abdominal tenderness.     Hernia: No hernia is present.  Genitourinary:     Comments: Normal external female genitalia No uterine or adnexal mass or tenderness 4 to 5 mm domed firm cystic lesion.  Moves with skin, not attached to underlying tissue at about 5 O'clock about anus. Musculoskeletal:        General: Normal range of motion.     Cervical back: Normal range of motion and neck supple.     Right lower leg: No edema.     Left lower leg: No edema.  Feet:     Right foot:     Skin integrity: Skin integrity normal.     Left foot:     Skin integrity: Skin integrity normal.  Lymphadenopathy:     Head:     Right side of head: No submental or submandibular adenopathy.     Left side of head: No submental or submandibular adenopathy.     Cervical: No cervical adenopathy.     Upper Body:     Right upper body: No supraclavicular or axillary adenopathy.     Left upper body: No supraclavicular or axillary adenopathy.     Lower Body: No right inguinal adenopathy. No left inguinal adenopathy.  Skin:    General: Skin is warm.     Capillary Refill: Capillary refill takes less than 2 seconds.     Findings: No rash.  Neurological:     General: No focal deficit present.     Mental Status: She is alert and oriented to person, place, and time.     Cranial Nerves: Cranial nerves 2-12 are intact.     Sensory: Sensation is intact.     Motor: Motor function is intact.     Coordination: Coordination is intact.     Gait: Gait is intact.     Deep Tendon Reflexes: Reflexes are normal and symmetric.  Psychiatric:        Mood and Affect: Mood normal.        Behavior: Behavior normal.      Assessment & Plan   CPE without pap Mammogram current No FIT as just had colonoscopy Return for fasting labs:   CBC, CMP, FLP  2.  Dental Cavities:  She will get set up with [facility] orange card and we will then forward  dental referral.    3.  Skin cyst, anus:  She will decide if she will wait for a general surgery appt to get this removed, or can return here and remove.  Appears to be an epidermoid cyst or similar.  4.  Hypertension:  controlled.

## 2024-02-14 ENCOUNTER — Telehealth: Payer: Self-pay | Admitting: Internal Medicine

## 2024-02-14 NOTE — Telephone Encounter (Signed)
 Patient would like to be on wait list for removal of domed firm cystic lesion .  We will call patient when there is a cancellation.

## 2024-03-11 ENCOUNTER — Ambulatory Visit: Payer: Self-pay | Admitting: Internal Medicine

## 2024-03-11 VITALS — BP 128/80 | HR 72 | Resp 18 | Ht 61.0 in | Wt 158.0 lb

## 2024-03-11 DIAGNOSIS — L720 Epidermal cyst: Secondary | ICD-10-CM

## 2024-03-11 NOTE — Patient Instructions (Signed)
 Call clinic if you have redness, bleeding you cannot control with direct pressure, increasing tenderness, redness, fever.  Tucks to pat clean after bathroom use.  Warm water bath soak once daily for 3 days and pat dry.

## 2024-03-11 NOTE — Progress Notes (Unsigned)
   Informed consent read to patient and went over in person.  She had no questions and signed.     Skin excision  Date/Time: 03/11/2024 5:54 PM  Performed by: Adella Norris, MD Authorized by: Adella Norris, MD   Number of Lesions: 1 Lesion 1:    Body area: anogenital   Anogenital location: perianal   Initial size (mm): 5   Final defect size (mm): 3   Malignancy: benign lesion     Destruction method comment: sharp incision and removal of interior contents, which was white/clear lumpy material.  No odor   Surgical defect: 3mm   Repair comments: Wound fell in skin folds of anus with good hemostasis with pressure.  Felt would heal better without suturing. 5 mm smooth domed cystic lesion at 6 O'clock of anus in recumbent position.

## 2024-03-13 ENCOUNTER — Other Ambulatory Visit: Payer: Self-pay

## 2024-03-13 DIAGNOSIS — Z0000 Encounter for general adult medical examination without abnormal findings: Secondary | ICD-10-CM

## 2024-03-14 LAB — COMPREHENSIVE METABOLIC PANEL WITH GFR
ALT: 27 IU/L (ref 0–32)
AST: 23 IU/L (ref 0–40)
Albumin: 4 g/dL (ref 3.9–4.9)
Alkaline Phosphatase: 56 IU/L (ref 44–121)
BUN/Creatinine Ratio: 40 — ABNORMAL HIGH (ref 9–23)
BUN: 20 mg/dL (ref 6–24)
Bilirubin Total: 0.8 mg/dL (ref 0.0–1.2)
CO2: 21 mmol/L (ref 20–29)
Calcium: 9.1 mg/dL (ref 8.7–10.2)
Chloride: 103 mmol/L (ref 96–106)
Creatinine, Ser: 0.5 mg/dL — ABNORMAL LOW (ref 0.57–1.00)
Globulin, Total: 2.9 g/dL (ref 1.5–4.5)
Glucose: 89 mg/dL (ref 70–99)
Potassium: 4.3 mmol/L (ref 3.5–5.2)
Sodium: 139 mmol/L (ref 134–144)
Total Protein: 6.9 g/dL (ref 6.0–8.5)
eGFR: 117 mL/min/1.73 (ref >59)

## 2024-03-14 LAB — CBC WITH DIFFERENTIAL/PLATELET
Basophils Absolute: 0 x10E3/uL (ref 0.0–0.2)
Basos: 1 %
EOS (ABSOLUTE): 0.1 x10E3/uL (ref 0.0–0.4)
Eos: 1 %
Hematocrit: 36.8 % (ref 34.0–46.6)
Hemoglobin: 11.4 g/dL (ref 11.1–15.9)
Immature Grans (Abs): 0 x10E3/uL (ref 0.0–0.1)
Immature Granulocytes: 0 %
Lymphocytes Absolute: 1.7 x10E3/uL (ref 0.7–3.1)
Lymphs: 35 %
MCH: 28.1 pg (ref 26.6–33.0)
MCHC: 31 g/dL — ABNORMAL LOW (ref 31.5–35.7)
MCV: 91 fL (ref 79–97)
Monocytes Absolute: 0.4 x10E3/uL (ref 0.1–0.9)
Monocytes: 8 %
Neutrophils Absolute: 2.8 x10E3/uL (ref 1.4–7.0)
Neutrophils: 55 %
Platelets: 268 x10E3/uL (ref 150–450)
RBC: 4.06 x10E6/uL (ref 3.77–5.28)
RDW: 14 % (ref 11.7–15.4)
WBC: 5 x10E3/uL (ref 3.4–10.8)

## 2024-03-14 LAB — LIPID PANEL
Chol/HDL Ratio: 2.5 ratio (ref 0.0–4.4)
Cholesterol, Total: 150 mg/dL (ref 100–199)
HDL: 60 mg/dL (ref >39)
LDL Chol Calc (NIH): 77 mg/dL (ref 0–99)
Triglycerides: 64 mg/dL (ref 0–149)
VLDL Cholesterol Cal: 13 mg/dL (ref 5–40)

## 2024-03-14 LAB — HEPATITIS C ANTIBODY: Hep C Virus Ab: NONREACTIVE

## 2024-03-14 LAB — HIV ANTIBODY (ROUTINE TESTING W REFLEX): HIV Screen 4th Generation wRfx: NONREACTIVE

## 2024-03-17 NOTE — Telephone Encounter (Signed)
 Patient was seen on 03/11/24

## 2024-03-26 ENCOUNTER — Telehealth: Payer: Self-pay | Admitting: Internal Medicine

## 2024-03-26 NOTE — Telephone Encounter (Signed)
 Patient called today and states she would like to know lab results from when she last had her blood draw 3 weeks ago.

## 2024-04-02 NOTE — Telephone Encounter (Signed)
 Spoke with patient with CMA, Per Dr. Adella, notified patient all labs were fine.

## 2024-05-03 ENCOUNTER — Ambulatory Visit: Payer: Self-pay | Admitting: Internal Medicine

## 2025-02-12 ENCOUNTER — Other Ambulatory Visit: Payer: Self-pay

## 2025-02-16 ENCOUNTER — Encounter: Payer: Self-pay | Admitting: Internal Medicine
